# Patient Record
Sex: Male | Born: 1935
Health system: Southern US, Community
[De-identification: ages and names within clinical notes are randomized; demographics above are authoritative.]

## PROBLEM LIST (undated history)

## (undated) DIAGNOSIS — N183 Chronic kidney disease, stage 3 unspecified: Secondary | ICD-10-CM

## (undated) DIAGNOSIS — C679 Malignant neoplasm of bladder, unspecified: Secondary | ICD-10-CM

## (undated) DIAGNOSIS — L853 Xerosis cutis: Secondary | ICD-10-CM

## (undated) DIAGNOSIS — Z85528 Personal history of other malignant neoplasm of kidney: Secondary | ICD-10-CM

## (undated) DIAGNOSIS — H3530 Unspecified macular degeneration: Secondary | ICD-10-CM

## (undated) DIAGNOSIS — Q600 Renal agenesis, unilateral: Secondary | ICD-10-CM

## (undated) DIAGNOSIS — Z8719 Personal history of other diseases of the digestive system: Secondary | ICD-10-CM

## (undated) DIAGNOSIS — M1990 Unspecified osteoarthritis, unspecified site: Secondary | ICD-10-CM

## (undated) DIAGNOSIS — IMO0002 Reserved for concepts with insufficient information to code with codable children: Secondary | ICD-10-CM

## (undated) DIAGNOSIS — H9190 Unspecified hearing loss, unspecified ear: Secondary | ICD-10-CM

## (undated) DIAGNOSIS — N281 Cyst of kidney, acquired: Secondary | ICD-10-CM

## (undated) HISTORY — PX: TOTAL NEPHRECTOMY: SHX415

---
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
## 1978-02-18 HISTORY — PX: CHOLECYSTECTOMY: SHX55

## 2007-06-16 ENCOUNTER — Ambulatory Visit (HOSPITAL_COMMUNITY): Admission: RE | Admit: 2007-06-16 | Discharge: 2007-06-16 | Payer: Self-pay | Admitting: Ophthalmology

## 2007-06-30 ENCOUNTER — Ambulatory Visit (HOSPITAL_COMMUNITY): Admission: RE | Admit: 2007-06-30 | Discharge: 2007-06-30 | Payer: Self-pay | Admitting: Ophthalmology

## 2007-11-10 ENCOUNTER — Ambulatory Visit (HOSPITAL_COMMUNITY): Admission: RE | Admit: 2007-11-10 | Discharge: 2007-11-10 | Payer: Self-pay | Admitting: Ophthalmology

## 2010-02-18 HISTORY — PX: CATARACT EXTRACTION W/ INTRAOCULAR LENS  IMPLANT, BILATERAL: SHX1307

## 2010-04-10 ENCOUNTER — Other Ambulatory Visit (HOSPITAL_COMMUNITY): Payer: Self-pay | Admitting: Family Medicine

## 2010-04-10 ENCOUNTER — Ambulatory Visit (HOSPITAL_COMMUNITY)
Admission: RE | Admit: 2010-04-10 | Discharge: 2010-04-10 | Disposition: A | Discharge status: Home / Self Care | Payer: Medicare HMO | Source: Ambulatory Visit | Attending: Family Medicine | Admitting: Family Medicine

## 2010-04-10 DIAGNOSIS — F17200 Nicotine dependence, unspecified, uncomplicated: Secondary | ICD-10-CM | POA: Insufficient documentation

## 2010-04-10 DIAGNOSIS — Z87891 Personal history of nicotine dependence: Secondary | ICD-10-CM

## 2010-11-02 ENCOUNTER — Other Ambulatory Visit (HOSPITAL_COMMUNITY): Payer: Self-pay | Admitting: Family Medicine

## 2010-11-02 ENCOUNTER — Ambulatory Visit (HOSPITAL_COMMUNITY)
Admission: RE | Admit: 2010-11-02 | Discharge: 2010-11-02 | Disposition: A | Discharge status: Home / Self Care | Payer: Medicare HMO | Source: Ambulatory Visit | Attending: Family Medicine | Admitting: Family Medicine

## 2010-11-02 DIAGNOSIS — W19XXXA Unspecified fall, initial encounter: Secondary | ICD-10-CM

## 2010-11-02 DIAGNOSIS — R0789 Other chest pain: Secondary | ICD-10-CM | POA: Insufficient documentation

## 2010-11-13 LAB — BASIC METABOLIC PANEL
BUN: 19
Calcium: 8.3 — ABNORMAL LOW
Creatinine, Ser: 1.27
GFR calc non Af Amer: 55 — ABNORMAL LOW
Glucose, Bld: 92
Potassium: 4.4

## 2010-11-13 LAB — HEMOGLOBIN AND HEMATOCRIT, BLOOD: Hemoglobin: 13.4

## 2013-07-15 ENCOUNTER — Ambulatory Visit (INDEPENDENT_AMBULATORY_CARE_PROVIDER_SITE_OTHER): Payer: Medicare HMO | Admitting: Otolaryngology

## 2013-07-15 DIAGNOSIS — J310 Chronic rhinitis: Secondary | ICD-10-CM

## 2013-07-15 DIAGNOSIS — H902 Conductive hearing loss, unspecified: Secondary | ICD-10-CM

## 2013-07-15 DIAGNOSIS — J342 Deviated nasal septum: Secondary | ICD-10-CM

## 2013-07-15 DIAGNOSIS — H903 Sensorineural hearing loss, bilateral: Secondary | ICD-10-CM

## 2013-07-15 DIAGNOSIS — H6120 Impacted cerumen, unspecified ear: Secondary | ICD-10-CM

## 2013-07-15 DIAGNOSIS — J343 Hypertrophy of nasal turbinates: Secondary | ICD-10-CM

## 2013-07-15 DIAGNOSIS — H6980 Other specified disorders of Eustachian tube, unspecified ear: Secondary | ICD-10-CM

## 2013-08-19 ENCOUNTER — Ambulatory Visit (INDEPENDENT_AMBULATORY_CARE_PROVIDER_SITE_OTHER): Payer: Medicare HMO | Admitting: Otolaryngology

## 2013-08-19 DIAGNOSIS — H6980 Other specified disorders of Eustachian tube, unspecified ear: Secondary | ICD-10-CM

## 2013-08-19 DIAGNOSIS — H6520 Chronic serous otitis media, unspecified ear: Secondary | ICD-10-CM

## 2013-08-19 DIAGNOSIS — H902 Conductive hearing loss, unspecified: Secondary | ICD-10-CM

## 2013-09-23 ENCOUNTER — Ambulatory Visit (INDEPENDENT_AMBULATORY_CARE_PROVIDER_SITE_OTHER): Payer: Medicare HMO | Admitting: Otolaryngology

## 2013-09-23 DIAGNOSIS — H6980 Other specified disorders of Eustachian tube, unspecified ear: Secondary | ICD-10-CM

## 2013-09-23 DIAGNOSIS — H902 Conductive hearing loss, unspecified: Secondary | ICD-10-CM

## 2013-09-23 DIAGNOSIS — H66019 Acute suppurative otitis media with spontaneous rupture of ear drum, unspecified ear: Secondary | ICD-10-CM

## 2013-09-30 ENCOUNTER — Ambulatory Visit (INDEPENDENT_AMBULATORY_CARE_PROVIDER_SITE_OTHER): Payer: Medicare HMO | Admitting: Otolaryngology

## 2013-09-30 DIAGNOSIS — H908 Mixed conductive and sensorineural hearing loss, unspecified: Secondary | ICD-10-CM

## 2013-09-30 DIAGNOSIS — H66019 Acute suppurative otitis media with spontaneous rupture of ear drum, unspecified ear: Secondary | ICD-10-CM

## 2013-10-14 ENCOUNTER — Ambulatory Visit (INDEPENDENT_AMBULATORY_CARE_PROVIDER_SITE_OTHER): Payer: Medicare HMO | Admitting: Otolaryngology

## 2013-10-14 DIAGNOSIS — H6990 Unspecified Eustachian tube disorder, unspecified ear: Secondary | ICD-10-CM | POA: Diagnosis not present

## 2013-10-14 DIAGNOSIS — H902 Conductive hearing loss, unspecified: Secondary | ICD-10-CM

## 2013-10-14 DIAGNOSIS — H6980 Other specified disorders of Eustachian tube, unspecified ear: Secondary | ICD-10-CM | POA: Diagnosis not present

## 2013-10-14 DIAGNOSIS — H7200 Central perforation of tympanic membrane, unspecified ear: Secondary | ICD-10-CM | POA: Diagnosis not present

## 2014-04-07 ENCOUNTER — Ambulatory Visit (INDEPENDENT_AMBULATORY_CARE_PROVIDER_SITE_OTHER): Payer: Medicare HMO | Admitting: Otolaryngology

## 2014-04-07 DIAGNOSIS — H66012 Acute suppurative otitis media with spontaneous rupture of ear drum, left ear: Secondary | ICD-10-CM

## 2014-09-15 ENCOUNTER — Ambulatory Visit (INDEPENDENT_AMBULATORY_CARE_PROVIDER_SITE_OTHER): Payer: Medicare HMO | Admitting: Otolaryngology

## 2014-09-15 DIAGNOSIS — H66012 Acute suppurative otitis media with spontaneous rupture of ear drum, left ear: Secondary | ICD-10-CM

## 2014-09-29 ENCOUNTER — Ambulatory Visit (INDEPENDENT_AMBULATORY_CARE_PROVIDER_SITE_OTHER): Payer: Medicare HMO | Admitting: Otolaryngology

## 2014-09-29 DIAGNOSIS — H66013 Acute suppurative otitis media with spontaneous rupture of ear drum, bilateral: Secondary | ICD-10-CM | POA: Diagnosis not present

## 2015-09-14 DIAGNOSIS — H9042 Sensorineural hearing loss, unilateral, left ear, with unrestricted hearing on the contralateral side: Secondary | ICD-10-CM | POA: Diagnosis not present

## 2015-09-14 DIAGNOSIS — H539 Unspecified visual disturbance: Secondary | ICD-10-CM | POA: Diagnosis not present

## 2015-09-14 DIAGNOSIS — N183 Chronic kidney disease, stage 3 (moderate): Secondary | ICD-10-CM | POA: Diagnosis not present

## 2015-09-14 DIAGNOSIS — E785 Hyperlipidemia, unspecified: Secondary | ICD-10-CM | POA: Diagnosis not present

## 2015-09-14 DIAGNOSIS — M20029 Boutonniere deformity of unspecified finger(s): Secondary | ICD-10-CM | POA: Diagnosis not present

## 2015-09-14 DIAGNOSIS — Z9181 History of falling: Secondary | ICD-10-CM | POA: Diagnosis not present

## 2015-09-14 DIAGNOSIS — L570 Actinic keratosis: Secondary | ICD-10-CM | POA: Diagnosis not present

## 2015-09-15 DIAGNOSIS — H9042 Sensorineural hearing loss, unilateral, left ear, with unrestricted hearing on the contralateral side: Secondary | ICD-10-CM | POA: Diagnosis not present

## 2015-09-15 DIAGNOSIS — L570 Actinic keratosis: Secondary | ICD-10-CM | POA: Diagnosis not present

## 2015-09-20 DIAGNOSIS — I10 Essential (primary) hypertension: Secondary | ICD-10-CM | POA: Diagnosis not present

## 2015-09-20 DIAGNOSIS — E785 Hyperlipidemia, unspecified: Secondary | ICD-10-CM | POA: Diagnosis not present

## 2015-09-20 DIAGNOSIS — L570 Actinic keratosis: Secondary | ICD-10-CM | POA: Diagnosis not present

## 2015-09-20 DIAGNOSIS — N183 Chronic kidney disease, stage 3 (moderate): Secondary | ICD-10-CM | POA: Diagnosis not present

## 2015-09-27 DIAGNOSIS — H353131 Nonexudative age-related macular degeneration, bilateral, early dry stage: Secondary | ICD-10-CM | POA: Diagnosis not present

## 2015-09-27 DIAGNOSIS — H538 Other visual disturbances: Secondary | ICD-10-CM | POA: Diagnosis not present

## 2015-10-06 DIAGNOSIS — N183 Chronic kidney disease, stage 3 (moderate): Secondary | ICD-10-CM | POA: Diagnosis not present

## 2015-10-06 DIAGNOSIS — M069 Rheumatoid arthritis, unspecified: Secondary | ICD-10-CM | POA: Diagnosis not present

## 2015-10-06 DIAGNOSIS — E669 Obesity, unspecified: Secondary | ICD-10-CM | POA: Diagnosis not present

## 2015-10-09 DIAGNOSIS — H353131 Nonexudative age-related macular degeneration, bilateral, early dry stage: Secondary | ICD-10-CM | POA: Diagnosis not present

## 2015-11-07 DIAGNOSIS — E559 Vitamin D deficiency, unspecified: Secondary | ICD-10-CM | POA: Diagnosis not present

## 2015-11-07 DIAGNOSIS — Z79899 Other long term (current) drug therapy: Secondary | ICD-10-CM | POA: Diagnosis not present

## 2015-11-07 DIAGNOSIS — R809 Proteinuria, unspecified: Secondary | ICD-10-CM | POA: Diagnosis not present

## 2015-11-07 DIAGNOSIS — N183 Chronic kidney disease, stage 3 (moderate): Secondary | ICD-10-CM | POA: Diagnosis not present

## 2015-11-07 DIAGNOSIS — D509 Iron deficiency anemia, unspecified: Secondary | ICD-10-CM | POA: Diagnosis not present

## 2015-11-07 DIAGNOSIS — Z85528 Personal history of other malignant neoplasm of kidney: Secondary | ICD-10-CM | POA: Diagnosis not present

## 2015-11-07 DIAGNOSIS — I10 Essential (primary) hypertension: Secondary | ICD-10-CM | POA: Diagnosis not present

## 2015-11-07 DIAGNOSIS — Z905 Acquired absence of kidney: Secondary | ICD-10-CM | POA: Diagnosis not present

## 2015-11-07 DIAGNOSIS — D519 Vitamin B12 deficiency anemia, unspecified: Secondary | ICD-10-CM | POA: Diagnosis not present

## 2015-11-15 DIAGNOSIS — E559 Vitamin D deficiency, unspecified: Secondary | ICD-10-CM | POA: Diagnosis not present

## 2015-11-15 DIAGNOSIS — R809 Proteinuria, unspecified: Secondary | ICD-10-CM | POA: Diagnosis not present

## 2015-11-15 DIAGNOSIS — E872 Acidosis: Secondary | ICD-10-CM | POA: Diagnosis not present

## 2015-11-15 DIAGNOSIS — N2581 Secondary hyperparathyroidism of renal origin: Secondary | ICD-10-CM | POA: Diagnosis not present

## 2015-11-15 DIAGNOSIS — N184 Chronic kidney disease, stage 4 (severe): Secondary | ICD-10-CM | POA: Diagnosis not present

## 2015-11-23 ENCOUNTER — Other Ambulatory Visit (HOSPITAL_COMMUNITY): Payer: Self-pay | Admitting: Nephrology

## 2015-11-23 DIAGNOSIS — N183 Chronic kidney disease, stage 3 unspecified: Secondary | ICD-10-CM

## 2015-11-23 DIAGNOSIS — Z905 Acquired absence of kidney: Secondary | ICD-10-CM

## 2015-12-06 ENCOUNTER — Ambulatory Visit (HOSPITAL_COMMUNITY)
Admission: RE | Admit: 2015-12-06 | Discharge: 2015-12-06 | Disposition: A | Discharge status: Home / Self Care | Payer: Commercial Managed Care - HMO | Source: Ambulatory Visit | Attending: Nephrology | Admitting: Nephrology

## 2015-12-06 DIAGNOSIS — N183 Chronic kidney disease, stage 3 unspecified: Secondary | ICD-10-CM

## 2015-12-06 DIAGNOSIS — N281 Cyst of kidney, acquired: Secondary | ICD-10-CM | POA: Insufficient documentation

## 2015-12-06 DIAGNOSIS — Z905 Acquired absence of kidney: Secondary | ICD-10-CM | POA: Diagnosis not present

## 2015-12-06 DIAGNOSIS — N3289 Other specified disorders of bladder: Secondary | ICD-10-CM | POA: Diagnosis not present

## 2015-12-06 DIAGNOSIS — K5730 Diverticulosis of large intestine without perforation or abscess without bleeding: Secondary | ICD-10-CM | POA: Diagnosis not present

## 2015-12-06 DIAGNOSIS — I700 Atherosclerosis of aorta: Secondary | ICD-10-CM | POA: Insufficient documentation

## 2015-12-11 DIAGNOSIS — R310 Gross hematuria: Secondary | ICD-10-CM | POA: Diagnosis not present

## 2015-12-11 DIAGNOSIS — D414 Neoplasm of uncertain behavior of bladder: Secondary | ICD-10-CM | POA: Diagnosis not present

## 2015-12-13 ENCOUNTER — Other Ambulatory Visit: Payer: Self-pay | Admitting: Urology

## 2015-12-26 ENCOUNTER — Encounter (HOSPITAL_BASED_OUTPATIENT_CLINIC_OR_DEPARTMENT_OTHER): Payer: Self-pay | Admitting: *Deleted

## 2015-12-27 ENCOUNTER — Encounter (HOSPITAL_BASED_OUTPATIENT_CLINIC_OR_DEPARTMENT_OTHER): Payer: Self-pay | Admitting: *Deleted

## 2015-12-27 NOTE — Progress Notes (Signed)
Spoke w pt and wife.  Instructed pt to be npo p mn Sunday x clear liquids until 0700.  Then absolutely nothing by mouth.  To [facility] 11/13 @ 1330.  Needs istat 8, ekg on arrival.  Pt has CKD stage 3.  IV NS should be used.

## 2016-01-01 ENCOUNTER — Encounter (HOSPITAL_BASED_OUTPATIENT_CLINIC_OR_DEPARTMENT_OTHER): Payer: Self-pay | Admitting: *Deleted

## 2016-01-01 ENCOUNTER — Other Ambulatory Visit: Payer: Self-pay

## 2016-01-01 ENCOUNTER — Ambulatory Visit (HOSPITAL_BASED_OUTPATIENT_CLINIC_OR_DEPARTMENT_OTHER): Payer: Commercial Managed Care - HMO | Admitting: Anesthesiology

## 2016-01-01 ENCOUNTER — Encounter (HOSPITAL_BASED_OUTPATIENT_CLINIC_OR_DEPARTMENT_OTHER)
Admission: RE | Disposition: A | Discharge status: Home / Self Care | Payer: Self-pay | Source: Ambulatory Visit | Attending: Urology

## 2016-01-01 ENCOUNTER — Ambulatory Visit (HOSPITAL_BASED_OUTPATIENT_CLINIC_OR_DEPARTMENT_OTHER)
Admission: RE | Admit: 2016-01-01 | Discharge: 2016-01-01 | Disposition: A | Discharge status: Home / Self Care | Payer: Commercial Managed Care - HMO | Source: Ambulatory Visit | Attending: Urology | Admitting: Urology

## 2016-01-01 DIAGNOSIS — R310 Gross hematuria: Secondary | ICD-10-CM | POA: Diagnosis not present

## 2016-01-01 DIAGNOSIS — Z905 Acquired absence of kidney: Secondary | ICD-10-CM | POA: Diagnosis not present

## 2016-01-01 DIAGNOSIS — H9190 Unspecified hearing loss, unspecified ear: Secondary | ICD-10-CM | POA: Insufficient documentation

## 2016-01-01 DIAGNOSIS — Z85528 Personal history of other malignant neoplasm of kidney: Secondary | ICD-10-CM | POA: Diagnosis not present

## 2016-01-01 DIAGNOSIS — D494 Neoplasm of unspecified behavior of bladder: Secondary | ICD-10-CM | POA: Diagnosis not present

## 2016-01-01 DIAGNOSIS — C679 Malignant neoplasm of bladder, unspecified: Secondary | ICD-10-CM | POA: Diagnosis not present

## 2016-01-01 DIAGNOSIS — N135 Crossing vessel and stricture of ureter without hydronephrosis: Secondary | ICD-10-CM | POA: Diagnosis not present

## 2016-01-01 DIAGNOSIS — C672 Malignant neoplasm of lateral wall of bladder: Secondary | ICD-10-CM | POA: Insufficient documentation

## 2016-01-01 DIAGNOSIS — N183 Chronic kidney disease, stage 3 (moderate): Secondary | ICD-10-CM | POA: Diagnosis not present

## 2016-01-01 DIAGNOSIS — Z466 Encounter for fitting and adjustment of urinary device: Secondary | ICD-10-CM | POA: Diagnosis not present

## 2016-01-01 HISTORY — DX: Personal history of other malignant neoplasm of kidney: Z85.528

## 2016-01-01 HISTORY — DX: Chronic kidney disease, stage 3 unspecified: N18.30

## 2016-01-01 HISTORY — DX: Cyst of kidney, acquired: N28.1

## 2016-01-01 HISTORY — DX: Unspecified osteoarthritis, unspecified site: M19.90

## 2016-01-01 HISTORY — DX: Chronic kidney disease, stage 3 (moderate): N18.3

## 2016-01-01 HISTORY — DX: Personal history of other diseases of the digestive system: Z87.19

## 2016-01-01 HISTORY — DX: Unspecified macular degeneration: H35.30

## 2016-01-01 HISTORY — DX: Reserved for concepts with insufficient information to code with codable children: IMO0002

## 2016-01-01 HISTORY — DX: Renal agenesis, unilateral: Q60.0

## 2016-01-01 HISTORY — DX: Unspecified hearing loss, unspecified ear: H91.90

## 2016-01-01 HISTORY — PX: CYSTOSCOPY WITH RETROGRADE PYELOGRAM, URETEROSCOPY AND STENT PLACEMENT: SHX5789

## 2016-01-01 HISTORY — DX: Xerosis cutis: L85.3

## 2016-01-01 HISTORY — PX: TRANSURETHRAL RESECTION OF BLADDER TUMOR: SHX2575

## 2016-01-01 HISTORY — PX: CYSTOSCOPY WITH URETHRAL DILATATION: SHX5125

## 2016-01-01 LAB — POCT I-STAT, CHEM 8
BUN: 29 mg/dL — AB (ref 6–20)
CHLORIDE: 108 mmol/L (ref 101–111)
Calcium, Ion: 1.26 mmol/L (ref 1.15–1.40)
Creatinine, Ser: 1.7 mg/dL — ABNORMAL HIGH (ref 0.61–1.24)
Glucose, Bld: 93 mg/dL (ref 65–99)
HEMATOCRIT: 41 % (ref 39.0–52.0)
Hemoglobin: 13.9 g/dL (ref 13.0–17.0)
POTASSIUM: 4.4 mmol/L (ref 3.5–5.1)
SODIUM: 142 mmol/L (ref 135–145)
TCO2: 22 mmol/L (ref 0–100)

## 2016-01-01 SURGERY — TURBT (TRANSURETHRAL RESECTION OF BLADDER TUMOR)
Anesthesia: General | Site: Urethra | Laterality: Right

## 2016-01-01 MED ORDER — ROCURONIUM BROMIDE 100 MG/10ML IV SOLN
INTRAVENOUS | Status: DC | PRN
  Administered 2016-01-01: 40 mg via INTRAVENOUS

## 2016-01-01 MED ORDER — TRAMADOL HCL 50 MG PO TABS
50.0000 mg | ORAL_TABLET | Freq: Once | ORAL | Status: AC
  Administered 2016-01-01: 50 mg via ORAL
  Filled 2016-01-01: qty 1

## 2016-01-01 MED ORDER — IOHEXOL 300 MG/ML  SOLN
INTRAMUSCULAR | Status: DC | PRN
  Administered 2016-01-01: 2 mL

## 2016-01-01 MED ORDER — ROCURONIUM BROMIDE 50 MG/5ML IV SOSY
PREFILLED_SYRINGE | INTRAVENOUS | Status: AC
  Filled 2016-01-01: qty 5

## 2016-01-01 MED ORDER — FENTANYL CITRATE (PF) 100 MCG/2ML IJ SOLN
25.0000 ug | INTRAMUSCULAR | Status: DC | PRN
  Filled 2016-01-01: qty 1

## 2016-01-01 MED ORDER — DEXAMETHASONE SODIUM PHOSPHATE 10 MG/ML IJ SOLN
INTRAMUSCULAR | Status: AC
  Filled 2016-01-01: qty 1

## 2016-01-01 MED ORDER — SUGAMMADEX SODIUM 200 MG/2ML IV SOLN
INTRAVENOUS | Status: AC
  Filled 2016-01-01: qty 2

## 2016-01-01 MED ORDER — EPHEDRINE 5 MG/ML INJ
INTRAVENOUS | Status: AC
  Filled 2016-01-01: qty 10

## 2016-01-01 MED ORDER — CEFAZOLIN SODIUM-DEXTROSE 2-4 GM/100ML-% IV SOLN
INTRAVENOUS | Status: AC
  Filled 2016-01-01: qty 100

## 2016-01-01 MED ORDER — PROPOFOL 10 MG/ML IV BOLUS
INTRAVENOUS | Status: DC | PRN
  Administered 2016-01-01: 170 mg via INTRAVENOUS
  Administered 2016-01-01: 30 mg via INTRAVENOUS

## 2016-01-01 MED ORDER — FENTANYL CITRATE (PF) 100 MCG/2ML IJ SOLN
INTRAMUSCULAR | Status: AC
  Filled 2016-01-01: qty 2

## 2016-01-01 MED ORDER — CEFAZOLIN IN D5W 1 GM/50ML IV SOLN
1.0000 g | INTRAVENOUS | Status: AC
  Administered 2016-01-01: 2 g via INTRAVENOUS
  Filled 2016-01-01: qty 50

## 2016-01-01 MED ORDER — LACTATED RINGERS IV SOLN
INTRAVENOUS | Status: DC
  Filled 2016-01-01: qty 1000

## 2016-01-01 MED ORDER — TRAMADOL HCL 50 MG PO TABS
ORAL_TABLET | ORAL | Status: AC
  Filled 2016-01-01: qty 1

## 2016-01-01 MED ORDER — ONDANSETRON HCL 4 MG/2ML IJ SOLN
INTRAMUSCULAR | Status: AC
  Filled 2016-01-01: qty 2

## 2016-01-01 MED ORDER — SUGAMMADEX SODIUM 200 MG/2ML IV SOLN
INTRAVENOUS | Status: DC | PRN
  Administered 2016-01-01: 200 mg via INTRAVENOUS
  Administered 2016-01-01: 100 mg via INTRAVENOUS

## 2016-01-01 MED ORDER — SODIUM CHLORIDE 0.9 % IV SOLN
INTRAVENOUS | Status: DC
  Administered 2016-01-01: 14:00:00 via INTRAVENOUS
  Filled 2016-01-01: qty 1000

## 2016-01-01 MED ORDER — ONDANSETRON HCL 4 MG/2ML IJ SOLN
INTRAMUSCULAR | Status: DC | PRN
Start: 2016-01-01 — End: 2016-01-01
  Administered 2016-01-01: 4 mg via INTRAVENOUS

## 2016-01-01 MED ORDER — PROPOFOL 10 MG/ML IV BOLUS
INTRAVENOUS | Status: AC
  Filled 2016-01-01: qty 40

## 2016-01-01 MED ORDER — FENTANYL CITRATE (PF) 100 MCG/2ML IJ SOLN
INTRAMUSCULAR | Status: DC | PRN
  Administered 2016-01-01 (×2): 50 ug via INTRAVENOUS

## 2016-01-01 MED ORDER — DEXAMETHASONE SODIUM PHOSPHATE 4 MG/ML IJ SOLN
INTRAMUSCULAR | Status: DC | PRN
  Administered 2016-01-01: 5 mg via INTRAVENOUS

## 2016-01-01 MED ORDER — SODIUM CHLORIDE 0.9 % IR SOLN
Status: DC | PRN
  Administered 2016-01-01: 6000 mL

## 2016-01-01 MED ORDER — TRAMADOL HCL 50 MG PO TABS: 50.0000 mg | ORAL_TABLET | Freq: Four times a day (QID) | ORAL | 0 refills | Status: DC | PRN

## 2016-01-01 MED ORDER — EPHEDRINE SULFATE-NACL 50-0.9 MG/10ML-% IV SOSY
PREFILLED_SYRINGE | INTRAVENOUS | Status: DC | PRN
  Administered 2016-01-01 (×2): 10 mg via INTRAVENOUS

## 2016-01-01 SURGICAL SUPPLY — 50 items
BAG DRAIN URO-CYSTO SKYTR STRL (DRAIN) ×5 IMPLANT
BAG DRN ANRFLXCHMBR STRAP LEK (BAG)
BAG DRN UROCATH (DRAIN) ×3
BAG URINE DRAINAGE (UROLOGICAL SUPPLIES) IMPLANT
BAG URINE LEG 19OZ MD ST LTX (BAG) IMPLANT
BAG URINE LEG 500ML (DRAIN) ×2 IMPLANT
BASKET DAKOTA 1.9FR 11X120 (BASKET) IMPLANT
BASKET LASER NITINOL 1.9FR (BASKET) IMPLANT
BASKET ZERO TIP NITINOL 2.4FR (BASKET) IMPLANT
BSKT STON RTRVL 120 1.9FR (BASKET)
BSKT STON RTRVL ZERO TP 2.4FR (BASKET)
CATH FOLEY 2WAY SLVR  5CC 22FR (CATHETERS)
CATH FOLEY 2WAY SLVR 30CC 20FR (CATHETERS) IMPLANT
CATH FOLEY 2WAY SLVR 30CC 22FR (CATHETERS) IMPLANT
CATH FOLEY 2WAY SLVR 5CC 22FR (CATHETERS) IMPLANT
CATH FOLEY 3WAY 30CC 22F (CATHETERS) ×2 IMPLANT
CATH INTERMIT  6FR 70CM (CATHETERS) ×5 IMPLANT
CLOTH BEACON ORANGE TIMEOUT ST (SAFETY) ×5 IMPLANT
ELECT REM PT RETURN 9FT ADLT (ELECTROSURGICAL) ×5
ELECTRODE REM PT RTRN 9FT ADLT (ELECTROSURGICAL) ×3 IMPLANT
EVACUATOR MICROVAS BLADDER (UROLOGICAL SUPPLIES) IMPLANT
FIBER LASER FLEXIVA 365 (UROLOGICAL SUPPLIES) IMPLANT
FIBER LASER TRAC TIP (UROLOGICAL SUPPLIES) IMPLANT
GLOVE BIO SURGEON STRL SZ8 (GLOVE) ×5 IMPLANT
GLOVE BIOGEL PI IND STRL 7.0 (GLOVE) IMPLANT
GLOVE BIOGEL PI IND STRL 7.5 (GLOVE) ×2 IMPLANT
GLOVE BIOGEL PI INDICATOR 7.0 (GLOVE) ×4
GLOVE BIOGEL PI INDICATOR 7.5 (GLOVE) ×4
GLOVE ECLIPSE 7.0 STRL STRAW (GLOVE) ×3 IMPLANT
GOWN STRL REUS W/ TWL LRG LVL3 (GOWN DISPOSABLE) ×3 IMPLANT
GOWN STRL REUS W/ TWL XL LVL3 (GOWN DISPOSABLE) ×3 IMPLANT
GOWN STRL REUS W/TWL LRG LVL3 (GOWN DISPOSABLE) ×7 IMPLANT
GOWN STRL REUS W/TWL XL LVL3 (GOWN DISPOSABLE) ×5
GUIDEWIRE ANG ZIPWIRE 038X150 (WIRE) ×5 IMPLANT
GUIDEWIRE STR DUAL SENSOR (WIRE) ×3 IMPLANT
HOLDER FOLEY CATH W/STRAP (MISCELLANEOUS) ×2 IMPLANT
IV NS IRRIG 3000ML ARTHROMATIC (IV SOLUTION) ×9 IMPLANT
KIT ROOM TURNOVER WOR (KITS) ×5 IMPLANT
MANIFOLD NEPTUNE II (INSTRUMENTS) ×3 IMPLANT
PACK CYSTO (CUSTOM PROCEDURE TRAY) ×5 IMPLANT
PLUG CATH AND CAP STER (CATHETERS) ×2 IMPLANT
SET ASPIRATION TUBING (TUBING) IMPLANT
STENT URET 6FRX26 CONTOUR (STENTS) ×3 IMPLANT
SYR 20CC LL (SYRINGE) IMPLANT
SYR 30ML LL (SYRINGE) ×3 IMPLANT
SYRINGE 10CC LL (SYRINGE) ×5 IMPLANT
SYRINGE IRR TOOMEY STRL 70CC (SYRINGE) ×2 IMPLANT
TUBE CONNECTING 12'X1/4 (SUCTIONS)
TUBE CONNECTING 12X1/4 (SUCTIONS) IMPLANT
TUBE FEEDING 8FR 16IN STR KANG (MISCELLANEOUS) IMPLANT

## 2016-01-01 NOTE — Anesthesia Procedure Notes (Signed)
Procedure Name: LMA Insertion Date/Time: 01/01/2016 2:38 PM Performed by: Wanita Chamberlain Pre-anesthesia Checklist: Patient identified, Timeout performed, Emergency Drugs available, Suction available and Patient being monitored Patient Re-evaluated:Patient Re-evaluated prior to inductionOxygen Delivery Method: Circle system utilized Preoxygenation: Pre-oxygenation with 100% oxygen Intubation Type: IV induction Ventilation: Mask ventilation without difficulty Laryngoscope Size: Mac and 3 Grade View: Grade II Tube type: Oral Tube size: 8.0 mm Number of attempts: 1 Airway Equipment and Method: Stylet Placement Confirmation: positive ETCO2,  CO2 detector,  breath sounds checked- equal and bilateral and ETT inserted through vocal cords under direct vision Secured at: 22 cm Tube secured with: Tape Dental Injury: Teeth and Oropharynx as per pre-operative assessment

## 2016-01-01 NOTE — H&P (Signed)
Urology Admission H&P  Chief Complaint: gross hematuria  History of Present Illness: Mr Caples is a 82yo with gross hematuria who was found to have a bladder tumor on Ct scan.  Past Medical History:  Diagnosis Date  . Arthritis    rheumatoid  . Bladder tumor   . CKD (chronic kidney disease), stage III   . Dry skin    generalized itching ?d/t kidney disease  . Dyspnea    with exertion  . H/O: GI bleed    d/t ASA  . Hematuria   . History of renal cell carcinoma    1980's left nephrectomy  . HOH (hard of hearing)    right ear better than left  . Macular degeneration of both eyes   . Renal cyst, right   . Solitary right kidney    acquired-- s/p left nephrectomy 1980's   Past Surgical History:  Procedure Laterality Date  . APPENDECTOMY  1954  . CHOLECYSTECTOMY  1980  . EYE SURGERY Bilateral    cataract extractions    Home Medications:  Prescriptions Prior to Admission  Medication Sig Dispense Refill Last Dose  . acetaminophen (TYLENOL) 325 MG tablet Take 650 mg by mouth every 6 (six) hours as needed.   Past Month at Unknown time  . sodium bicarbonate 650 MG tablet Take 1,000 mg by mouth 3 (three) times daily.   Past Month at Unknown time   Allergies: No Known Allergies  History reviewed. No pertinent family history. Social History:  reports that he quit smoking about 10 years ago. His smoking use included Pipe. He has never used smokeless tobacco. He reports that he does not drink alcohol. His drug history is not on file.  Review of Systems  Genitourinary: Positive for hematuria.  All other systems reviewed and are negative.   Physical Exam:  Vital signs in last 24 hours: Temp:  [97.5 F (36.4 C)] 97.5 F (36.4 C) (11/13 1318) Pulse Rate:  [79] 79 (11/13 1318) Resp:  [16] 16 (11/13 1318) BP: (148)/(87) 148/87 (11/13 1318) SpO2:  [98 %] 98 % (11/13 1318) Weight:  [114.3 kg (252 lb)] 114.3 kg (252 lb) (11/13 1318) Physical Exam  Constitutional: He is oriented  to person, place, and time. He appears well-developed and well-nourished.  HENT:  Head: Normocephalic and atraumatic.  Eyes: EOM are normal. Pupils are equal, round, and reactive to light.  Neck: Normal range of motion. No thyromegaly present.  Cardiovascular: Normal rate and regular rhythm.   Respiratory: No respiratory distress.  GI: Soft. He exhibits no distension.  Musculoskeletal: Normal range of motion. He exhibits no edema.  Neurological: He is alert and oriented to person, place, and time.  Skin: Skin is warm and dry.  Psychiatric: He has a normal mood and affect. His behavior is normal. Judgment and thought content normal.    Laboratory Data:  No results found for this or any previous visit (from the past 24 hour(s)). No results found for this or any previous visit (from the past 240 hour(s)). Creatinine: No results for input(s): CREATININE in the last 168 hours. Baseline Creatinine: unknown  Impression/Assessment:  82yo with bladder tumor  Plan:  The risks/benefits/alternatives to TURBT was explained to the patient and he understands and wishes to proceed with surgery   Nicolette Bang 01/01/2016, 2:15 PM

## 2016-01-01 NOTE — Transfer of Care (Signed)
Last Vitals:  Vitals:   01/01/16 1318  BP: (!) 148/87  Pulse: 79  Resp: 16  Temp: 36.4 C    Last Pain:  Vitals:   01/01/16 1318  TempSrc: Oral      Patients Stated Pain Goal: 6 (01/01/16 1340)  Immediate Anesthesia Transfer of Care Note  Patient: Scott Harvey  Procedure(s) Performed: Procedure(s) (LRB): TRANSURETHRAL RESECTION OF BLADDER TUMOR (TURBT) (N/A) CYSTOSCOPY WITH RETROGRADE PYELOGRAM/URETERAL STENT PLACEMENT (Right) CYSTOSCOPY WITH URETHRAL DILATATION (N/A)  Patient Location: PACU  Anesthesia Type: General  Level of Consciousness: awake, alert  and oriented  Airway & Oxygen Therapy: Patient Spontanous Breathing and Patient connected to face mask oxygen  Post-op Assessment: Report given to PACU RN and Post -op Vital signs reviewed and stable  Post vital signs: Reviewed and stable  Complications: No apparent anesthesia complications

## 2016-01-01 NOTE — Discharge Instructions (Signed)
Post Anesthesia Home Care Instructions  Activity: Get plenty of rest for the remainder of the day. A responsible adult should stay with you for 24 hours following the procedure.  For the next 24 hours, DO NOT: -Drive a car -Paediatric nurse -Drink alcoholic beverages -Take any medication unless instructed by your physician -Make any legal decisions or sign important papers.  Meals: Start with liquid foods such as gelatin or soup. Progress to regular foods as tolerated. Avoid greasy, spicy, heavy foods. If nausea and/or vomiting occur, drink only clear liquids until the nausea and/or vomiting subsides. Call your physician if vomiting continues.  Special Instructions/Symptoms: Your throat may feel dry or sore from the anesthesia or the breathing tube placed in your throat during surgery. If this causes discomfort, gargle with warm salt water. The discomfort should disappear within 24 hours.  If you had a scopolamine patch placed behind your ear for the management of post- operative nausea and/or vomiting:  1. The medication in the patch is effective for 72 hours, after which it should be removed.  Wrap patch in a tissue and discard in the trash. Wash hands thoroughly with soap and water. 2. You may remove the patch earlier than 72 hours if you experience unpleasant side effects which may include dry mouth, dizziness or visual disturbances. 3. Avoid touching the patch. Wash your hands with soap and water after contact with the patch.   Foley Catheter Care, Adult A Foley catheter is a soft, flexible tube. This tube is placed into your bladder to drain pee (urine). If you go home with this catheter in place, follow the instructions below. TAKING CARE OF THE CATHETER 1. Wash your hands with soap and water. 2. Put soap and water on a clean washcloth.  Clean the skin where the tube goes into your body.  Clean away from the tube site.  Never wipe toward the tube.  Clean the area using a  circular motion.  Remove all the soap. Pat the area dry with a clean towel. For males, reposition the skin that covers the end of the penis (foreskin). 3. Attach the tube to your leg with tape or a leg strap. Do not stretch the tube tight. If you are using tape, remove any stickiness left behind by past tape you used. 4. Keep the drainage bag below your hips. Keep it off the floor. 5. Check your tube during the day. Make sure it is working and draining. Make sure the tube does not curl, twist, or bend. 6. Do not pull on the tube or try to take it out. TAKING CARE OF THE DRAINAGE BAGS You will have a large overnight drainage bag and a small leg bag. You may wear the overnight bag any time. Never wear the small bag at night. Follow the directions below. Emptying the Drainage Bag Empty your drainage bag when it is  - full or at least 2-3 times a day. 1. Wash your hands with soap and water. 2. Keep the drainage bag below your hips. 3. Hold the dirty bag over the toilet or clean container. 4. Open the pour spout at the bottom of the bag. Empty the pee into the toilet or container. Do not let the pour spout touch anything. 5. Clean the pour spout with a gauze pad or cotton ball that has rubbing alcohol on it. 6. Close the pour spout. 7. Attach the bag to your leg with tape or a leg strap. 8. Wash your hands well. Changing  the Drainage Bag Change your bag once a month or sooner if it starts to smell or look dirty.  1. Wash your hands with soap and water. 2. Pinch the rubber tube so that pee does not spill out. 3. Disconnect the catheter tube from the drainage tube at the connection valve. Do not let the tubes touch anything. 4. Clean the end of the catheter tube with an alcohol wipe. Clean the end of a the drainage tube with a different alcohol wipe. 5. Connect the catheter tube to the drainage tube of the clean drainage bag. 6. Attach the new bag to the leg with tape or a leg strap. Avoid  attaching the new bag too tightly. 7. Wash your hands well. Cleaning the Drainage Bag 1. Wash your hands with soap and water. 2. Wash the bag in warm, soapy water. 3. Rinse the bag with warm water. 4. Fill the bag with a mixture of white vinegar and water (1 cup vinegar to 1 quart warm water [.2 liter vinegar to 1 liter warm water]). Close the bag and soak it for 30 minutes in the solution. 5. Rinse the bag with warm water. 6. Hang the bag to dry with the pour spout open and hanging downward. 7. Store the clean bag (once it is dry) in a clean plastic bag. 8. Wash your hands well. PREVENT INFECTION  Wash your hands before and after touching your tube.  Take showers every day. Wash the skin where the tube enters your body. Do not take baths. Replace wet leg straps with dry ones, if this applies.  Do not use powders, sprays, or lotions on the genital area. Only use creams, lotions, or ointments as told by your doctor.  For females, wipe from front to back after going to the bathroom.  Drink enough fluids to keep your pee clear or pale yellow unless you are told not to have too much fluid (fluid restriction).  Do not let the drainage bag or tubing touch or lie on the floor.  Wear cotton underwear to keep the area dry. GET HELP IF:  Your pee is cloudy or smells unusually bad.  Your tube becomes clogged.  You are not draining pee into the bag or your bladder feels full.  Your tube starts to leak. GET HELP RIGHT AWAY IF:  You have pain, puffiness (swelling), redness, or yellowish-white fluid (pus) where the tube enters the body.  You have pain in the belly (abdomen), legs, lower back, or bladder.  You have a fever.  You see blood fill the tube, or your pee is pink or red.  You feel sick to your stomach (nauseous), throw up (vomit), or have chills.  Your tube gets pulled out. MAKE SURE YOU:   Understand these instructions.  Will watch your condition.  Will get help right  away if you are not doing well or get worse.   This information is not intended to replace advice given to you by your health care provider. Make sure you discuss any questions you have with your health care provider.   Document Released: 06/01/2012 Document Revised: 02/25/2014 Document Reviewed: 06/01/2012 Elsevier Interactive Patient Education [employer].

## 2016-01-01 NOTE — Anesthesia Preprocedure Evaluation (Addendum)
Anesthesia Evaluation  Patient identified by MRN, date of birth, ID band Patient awake    Reviewed: Allergy & Precautions, NPO status , Patient's Chart, lab work & pertinent test results  Airway Mallampati: II  TM Distance: >3 FB Neck ROM: Full    Dental  (+) Dental Advisory Given   Pulmonary former smoker,    breath sounds clear to auscultation       Cardiovascular negative cardio ROS   Rhythm:Regular Rate:Normal     Neuro/Psych negative neurological ROS  negative psych ROS   GI/Hepatic negative GI ROS, Neg liver ROS,   Endo/Other  negative endocrine ROS  Renal/GU CRFRenal disease  negative genitourinary   Musculoskeletal  (+) Arthritis , Osteoarthritis,    Abdominal   Peds negative pediatric ROS (+)  Hematology negative hematology ROS (+)   Anesthesia Other Findings   Reproductive/Obstetrics negative OB ROS                            Lab Results  Component Value Date   HGB 13.4 06/11/2007   HCT 38.6 (L) 06/11/2007   Lab Results  Component Value Date   CREATININE 1.27 06/11/2007   BUN 19 06/11/2007   NA 142 06/11/2007   K 4.4 06/11/2007   CL 109 06/11/2007   CO2 28 06/11/2007    Anesthesia Physical Anesthesia Plan  ASA: III  Anesthesia Plan: General   Post-op Pain Management:    Induction: Intravenous  Airway Management Planned: Oral ETT  Additional Equipment:   Intra-op Plan:   Post-operative Plan: Extubation in OR  Informed Consent: I have reviewed the patients History and Physical, chart, labs and discussed the procedure including the risks, benefits and alternatives for the proposed anesthesia with the patient or authorized representative who has indicated his/her understanding and acceptance.   Dental advisory given  Plan Discussed with: CRNA  Anesthesia Plan Comments:        Anesthesia Quick Evaluation

## 2016-01-01 NOTE — Op Note (Signed)
Preoperative diagnosis: Bladder Tumor  Postop diagnosis: Same  Procedure: 1.  Cystoscopy 2. right retrograde pyelography 3. Intra-operative fluoroscopy, under 1 hour, with interpretation 4.Transurethral resection of bladder tumor, large 5. Right 6x26 JJ ureteral stent placementStent Placement     Attending: Nicolette Bang  Anesthesia: General  Estimated blood loss: 5 cc  Drains: 1. 22 French Foley catheter 2. Right 6x26 JJ ureteral stent without tether   Specimens: Bladder tumor  Antibiotics: Ancef  Findings: narrowing of right distal ureter without associated hydronephrosis. 7cm papillary tumor involving the right lateral wall  Indications: Patient is a 82 year old with a history of  bladder tumor found on CT scan. He has a history of a solitary kidney.   After discussing treatment options patient decided to proceed with transurethral resection of bladder tumor  Procedure in detail: Prior to procedure consetn was obtained. Patient was brought to the operating room and briefing was done sure correct patient, correct procedure, correct site.  General anesthesia was in administered patient was placed in the dorsal lithotomy position.  The rigid 9 French cystoscope was passed urethra and bladder.  Bladder was inspected masses or lesions and we noted a 7 cm lesion in the right lateral wall of the bladder 1cm from the ureteral orifice.  We then cannulated the right ureteral orifice with a 6 French ureteral catheter.  A gentle retrograde was obtained in findings noted above.   We then placed a zip wire through the ureteral catheter and advanced up to the renal pelvis.  We then placed a 6 x 26 double-J ureteral stent over the wire.  We then removed the wire and good coil was noted in the pelvis under fluoroscopy in the bladder under direct vision.  Removed the cystoscope and placed a 90 French resectoscope in the bladder.  Using bipolar electrocautery were then removed the  bladder tumor.  We  removed the bladder tumor down to the base exposing muscle.  We then removed the pieces and sent them for pathology.  To obtain hemostasis we then cauterized the bed of the tumors.  Once good hemostasis was noted the bladder was then drained and a 22 French Foley catheter was placed. This concluded the procedure which resulted by the patient.  Complications: None  Condition: Stable,  extubated, transferred to PACU.  Plan: The patient is to be discharged home. He is to followup in 5 days for a voiding trial

## 2016-01-02 ENCOUNTER — Encounter (HOSPITAL_BASED_OUTPATIENT_CLINIC_OR_DEPARTMENT_OTHER): Payer: Self-pay | Admitting: Urology

## 2016-01-02 NOTE — Anesthesia Postprocedure Evaluation (Signed)
Anesthesia Post Note  Patient: Scott Harvey  Procedure(s) Performed: Procedure(s) (LRB): TRANSURETHRAL RESECTION OF BLADDER TUMOR (TURBT) (N/A) CYSTOSCOPY WITH RETROGRADE PYELOGRAM/URETERAL STENT PLACEMENT (Right) CYSTOSCOPY WITH URETHRAL DILATATION (N/A)  Patient location during evaluation: PACU Anesthesia Type: General Level of consciousness: awake and alert Pain management: pain level controlled Vital Signs Assessment: post-procedure vital signs reviewed and stable Respiratory status: spontaneous breathing, nonlabored ventilation, respiratory function stable and patient connected to nasal cannula oxygen Cardiovascular status: blood pressure returned to baseline and stable Postop Assessment: no signs of nausea or vomiting Anesthetic complications: no    Last Vitals:  Vitals:   01/01/16 1600 01/01/16 1634  BP: (!) 141/91 (!) 171/85  Pulse: 69 70  Resp: 19   Temp:  36.5 C    Last Pain:  Vitals:   01/01/16 1634  TempSrc:   PainSc: [place] Antwyne Pingree

## 2016-01-05 DIAGNOSIS — C678 Malignant neoplasm of overlapping sites of bladder: Secondary | ICD-10-CM | POA: Diagnosis not present

## 2016-01-09 ENCOUNTER — Other Ambulatory Visit: Payer: Self-pay | Admitting: Urology

## 2016-01-18 DIAGNOSIS — Z85528 Personal history of other malignant neoplasm of kidney: Secondary | ICD-10-CM | POA: Diagnosis not present

## 2016-01-18 DIAGNOSIS — M069 Rheumatoid arthritis, unspecified: Secondary | ICD-10-CM | POA: Diagnosis not present

## 2016-01-18 DIAGNOSIS — N184 Chronic kidney disease, stage 4 (severe): Secondary | ICD-10-CM | POA: Diagnosis not present

## 2016-01-19 DIAGNOSIS — M064 Inflammatory polyarthropathy: Secondary | ICD-10-CM | POA: Diagnosis not present

## 2016-01-19 DIAGNOSIS — Z85528 Personal history of other malignant neoplasm of kidney: Secondary | ICD-10-CM | POA: Diagnosis not present

## 2016-01-19 DIAGNOSIS — N184 Chronic kidney disease, stage 4 (severe): Secondary | ICD-10-CM | POA: Diagnosis not present

## 2016-02-01 ENCOUNTER — Encounter (HOSPITAL_BASED_OUTPATIENT_CLINIC_OR_DEPARTMENT_OTHER): Payer: Self-pay | Admitting: *Deleted

## 2016-02-01 NOTE — Progress Notes (Addendum)
SPOKE W/ WIFE.  NPO AFTER MN W/ EXCEPTION CLEAR LIQUIDS UNTIL 0730 (NO CREAM / MILK PRODUCTS) .  ARRIVE AT 1200.  NEEDS ISTAT 8.  CURRENT EKG IN CHART AND EPIC.

## 2016-02-05 ENCOUNTER — Ambulatory Visit (HOSPITAL_BASED_OUTPATIENT_CLINIC_OR_DEPARTMENT_OTHER): Admission: RE | Admit: 2016-02-05 | Payer: Commercial Managed Care - HMO | Source: Ambulatory Visit | Admitting: Urology

## 2016-02-05 ENCOUNTER — Other Ambulatory Visit: Payer: Self-pay | Admitting: Urology

## 2016-02-05 ENCOUNTER — Encounter (HOSPITAL_COMMUNITY): Payer: Self-pay

## 2016-02-05 HISTORY — DX: Malignant neoplasm of bladder, unspecified: C67.9

## 2016-02-05 SURGERY — TURBT (TRANSURETHRAL RESECTION OF BLADDER TUMOR)
Anesthesia: General

## 2016-02-06 ENCOUNTER — Encounter (HOSPITAL_COMMUNITY)
Admission: RE | Admit: 2016-02-06 | Discharge: 2016-02-06 | Disposition: A | Discharge status: Home / Self Care | Payer: Commercial Managed Care - HMO | Source: Ambulatory Visit | Attending: Urology | Admitting: Urology

## 2016-02-07 ENCOUNTER — Ambulatory Visit (HOSPITAL_COMMUNITY): Payer: Commercial Managed Care - HMO | Admitting: Anesthesiology

## 2016-02-07 ENCOUNTER — Ambulatory Visit (HOSPITAL_COMMUNITY): Payer: Commercial Managed Care - HMO

## 2016-02-07 ENCOUNTER — Encounter (HOSPITAL_COMMUNITY)
Admission: RE | Disposition: A | Discharge status: Home / Self Care | Payer: Self-pay | Source: Ambulatory Visit | Attending: Urology

## 2016-02-07 ENCOUNTER — Ambulatory Visit (HOSPITAL_COMMUNITY)
Admission: RE | Admit: 2016-02-07 | Discharge: 2016-02-07 | Disposition: A | Discharge status: Home / Self Care | Payer: Commercial Managed Care - HMO | Source: Ambulatory Visit | Attending: Urology | Admitting: Urology

## 2016-02-07 ENCOUNTER — Encounter (HOSPITAL_COMMUNITY): Payer: Self-pay | Admitting: *Deleted

## 2016-02-07 DIAGNOSIS — Z9841 Cataract extraction status, right eye: Secondary | ICD-10-CM | POA: Insufficient documentation

## 2016-02-07 DIAGNOSIS — N3289 Other specified disorders of bladder: Secondary | ICD-10-CM | POA: Diagnosis not present

## 2016-02-07 DIAGNOSIS — H3530 Unspecified macular degeneration: Secondary | ICD-10-CM | POA: Diagnosis not present

## 2016-02-07 DIAGNOSIS — Z87891 Personal history of nicotine dependence: Secondary | ICD-10-CM | POA: Diagnosis not present

## 2016-02-07 DIAGNOSIS — Z905 Acquired absence of kidney: Secondary | ICD-10-CM | POA: Insufficient documentation

## 2016-02-07 DIAGNOSIS — D494 Neoplasm of unspecified behavior of bladder: Secondary | ICD-10-CM | POA: Diagnosis not present

## 2016-02-07 DIAGNOSIS — Z85528 Personal history of other malignant neoplasm of kidney: Secondary | ICD-10-CM | POA: Diagnosis not present

## 2016-02-07 DIAGNOSIS — M1990 Unspecified osteoarthritis, unspecified site: Secondary | ICD-10-CM | POA: Diagnosis not present

## 2016-02-07 DIAGNOSIS — N3090 Cystitis, unspecified without hematuria: Secondary | ICD-10-CM | POA: Diagnosis not present

## 2016-02-07 DIAGNOSIS — Z961 Presence of intraocular lens: Secondary | ICD-10-CM | POA: Insufficient documentation

## 2016-02-07 DIAGNOSIS — Z9842 Cataract extraction status, left eye: Secondary | ICD-10-CM | POA: Diagnosis not present

## 2016-02-07 DIAGNOSIS — N183 Chronic kidney disease, stage 3 (moderate): Secondary | ICD-10-CM | POA: Insufficient documentation

## 2016-02-07 DIAGNOSIS — C679 Malignant neoplasm of bladder, unspecified: Secondary | ICD-10-CM | POA: Diagnosis not present

## 2016-02-07 HISTORY — PX: CYSTOSCOPY WITH FULGERATION: SHX6638

## 2016-02-07 LAB — BASIC METABOLIC PANEL
Anion gap: 6 (ref 5–15)
BUN: 24 mg/dL — AB (ref 6–20)
CALCIUM: 8.7 mg/dL — AB (ref 8.9–10.3)
CO2: 20 mmol/L — AB (ref 22–32)
Chloride: 113 mmol/L — ABNORMAL HIGH (ref 101–111)
Creatinine, Ser: 1.64 mg/dL — ABNORMAL HIGH (ref 0.61–1.24)
GFR calc Af Amer: 42 mL/min — ABNORMAL LOW (ref >60)
GFR calc non Af Amer: 37 mL/min — ABNORMAL LOW (ref >60)
GLUCOSE: 97 mg/dL (ref 65–99)
Potassium: 4.6 mmol/L (ref 3.5–5.1)
Sodium: 139 mmol/L (ref 135–145)

## 2016-02-07 SURGERY — CYSTOSCOPY, WITH BLADDER FULGURATION
Anesthesia: General

## 2016-02-07 MED ORDER — ROCURONIUM 10MG/ML (10ML) SYRINGE FOR MEDFUSION PUMP - OPTIME
INTRAVENOUS | Status: DC | PRN
  Administered 2016-02-07: 15 mg via INTRAVENOUS
  Administered 2016-02-07: 5 mg via INTRAVENOUS

## 2016-02-07 MED ORDER — FENTANYL CITRATE (PF) 100 MCG/2ML IJ SOLN
INTRAMUSCULAR | Status: DC | PRN
  Administered 2016-02-07 (×3): 50 ug via INTRAVENOUS

## 2016-02-07 MED ORDER — CEFAZOLIN IN D5W 1 GM/50ML IV SOLN
1.0000 g | INTRAVENOUS | Status: DC
  Filled 2016-02-07: qty 50

## 2016-02-07 MED ORDER — ONDANSETRON HCL 4 MG/2ML IJ SOLN
4.0000 mg | Freq: Once | INTRAMUSCULAR | Status: AC
  Administered 2016-02-07: 4 mg via INTRAVENOUS

## 2016-02-07 MED ORDER — FENTANYL CITRATE (PF) 100 MCG/2ML IJ SOLN
25.0000 ug | INTRAMUSCULAR | Status: DC | PRN
  Administered 2016-02-07 (×2): 50 ug via INTRAVENOUS
  Filled 2016-02-07: qty 2

## 2016-02-07 MED ORDER — GLYCOPYRROLATE 0.2 MG/ML IJ SOLN
INTRAMUSCULAR | Status: AC
  Filled 2016-02-07: qty 1

## 2016-02-07 MED ORDER — PROPOFOL 10 MG/ML IV BOLUS
INTRAVENOUS | Status: AC
  Filled 2016-02-07: qty 20

## 2016-02-07 MED ORDER — NEOSTIGMINE METHYLSULFATE 10 MG/10ML IV SOLN
INTRAVENOUS | Status: AC
  Filled 2016-02-07: qty 1

## 2016-02-07 MED ORDER — MIDAZOLAM HCL 2 MG/2ML IJ SOLN
1.0000 mg | INTRAMUSCULAR | Status: DC | PRN
  Administered 2016-02-07: 2 mg via INTRAVENOUS

## 2016-02-07 MED ORDER — DEXTROSE 5 % IV SOLN
2.0000 g | INTRAVENOUS | Status: AC
  Administered 2016-02-07: 2 g via INTRAVENOUS
  Filled 2016-02-07: qty 2

## 2016-02-07 MED ORDER — EPHEDRINE SULFATE 50 MG/ML IJ SOLN
INTRAMUSCULAR | Status: DC | PRN
  Administered 2016-02-07: 10 mg via INTRAVENOUS

## 2016-02-07 MED ORDER — GLYCOPYRROLATE 0.2 MG/ML IJ SOLN
INTRAMUSCULAR | Status: DC | PRN
  Administered 2016-02-07: .7 mg via INTRAVENOUS

## 2016-02-07 MED ORDER — SODIUM CHLORIDE 0.9 % IV SOLN: INTRAVENOUS | Status: DC

## 2016-02-07 MED ORDER — MIDAZOLAM HCL 2 MG/2ML IJ SOLN
INTRAMUSCULAR | Status: AC
  Filled 2016-02-07: qty 2

## 2016-02-07 MED ORDER — TRAMADOL HCL 50 MG PO TABS: 50.0000 mg | ORAL_TABLET | Freq: Four times a day (QID) | ORAL | 0 refills | Status: DC | PRN

## 2016-02-07 MED ORDER — FENTANYL CITRATE (PF) 250 MCG/5ML IJ SOLN
INTRAMUSCULAR | Status: AC
  Filled 2016-02-07: qty 5

## 2016-02-07 MED ORDER — DIATRIZOATE MEGLUMINE 30 % UR SOLN
URETHRAL | Status: AC
  Filled 2016-02-07: qty 300

## 2016-02-07 MED ORDER — LACTATED RINGERS IV SOLN
INTRAVENOUS | Status: DC
  Administered 2016-02-07: 11:00:00 via INTRAVENOUS

## 2016-02-07 MED ORDER — ROCURONIUM BROMIDE 50 MG/5ML IV SOLN
INTRAVENOUS | Status: AC
  Filled 2016-02-07: qty 1

## 2016-02-07 MED ORDER — PROPOFOL 10 MG/ML IV BOLUS
INTRAVENOUS | Status: DC | PRN
  Administered 2016-02-07: 150 mg via INTRAVENOUS

## 2016-02-07 MED ORDER — SODIUM CHLORIDE 0.9 % IR SOLN
Status: DC | PRN
  Administered 2016-02-07: 3000 mL

## 2016-02-07 MED ORDER — LIDOCAINE HCL (PF) 1 % IJ SOLN
INTRAMUSCULAR | Status: AC
  Filled 2016-02-07: qty 10

## 2016-02-07 MED ORDER — SUCCINYLCHOLINE 20MG/ML (10ML) SYRINGE FOR MEDFUSION PUMP - OPTIME
INTRAMUSCULAR | Status: DC | PRN
  Administered 2016-02-07: 120 mg via INTRAVENOUS

## 2016-02-07 MED ORDER — GLYCOPYRROLATE 0.2 MG/ML IJ SOLN
INTRAMUSCULAR | Status: AC
  Filled 2016-02-07: qty 3

## 2016-02-07 MED ORDER — NEOSTIGMINE METHYLSULFATE 10 MG/10ML IV SOLN
INTRAVENOUS | Status: DC | PRN
  Administered 2016-02-07: 4 mg via INTRAVENOUS

## 2016-02-07 MED ORDER — SUCCINYLCHOLINE CHLORIDE 20 MG/ML IJ SOLN
INTRAMUSCULAR | Status: AC
  Filled 2016-02-07: qty 1

## 2016-02-07 MED ORDER — STERILE WATER FOR IRRIGATION IR SOLN
Status: DC | PRN
  Administered 2016-02-07: 3000 mL

## 2016-02-07 MED ORDER — LIDOCAINE HCL (CARDIAC) 10 MG/ML IV SOLN
INTRAVENOUS | Status: DC | PRN
  Administered 2016-02-07: 40 mg via INTRAVENOUS

## 2016-02-07 MED ORDER — ONDANSETRON HCL 4 MG/2ML IJ SOLN
INTRAMUSCULAR | Status: AC
  Filled 2016-02-07: qty 2

## 2016-02-07 SURGICAL SUPPLY — 46 items
BAG DRAIN URO TABLE W/ADPT NS (DRAPE) ×4 IMPLANT
BAG DRN 8 ADPR NS SKTRN CSTL (DRAPE) ×2
BAG DRN URN TUBE DRIP CHMBR (OSTOMY)
BAG HAMPER (MISCELLANEOUS) ×4 IMPLANT
BAG URINE DRAIN TURP 4L (OSTOMY) ×1 IMPLANT
BAG URINE DRAINAGE (UROLOGICAL SUPPLIES) ×3 IMPLANT
CATH FOLEY 2WAY SLVR  5CC 22FR (CATHETERS)
CATH FOLEY 2WAY SLVR 30CC 20FR (CATHETERS) IMPLANT
CATH FOLEY 2WAY SLVR 30CC 22FR (CATHETERS) IMPLANT
CATH FOLEY 2WAY SLVR 5CC 22FR (CATHETERS) IMPLANT
CATH FOLEY 3WAY 30CC 22F (CATHETERS) ×3 IMPLANT
CATH FOLEY LATEX FREE 22FR (CATHETERS)
CATH FOLEY LF 22FR (CATHETERS) ×1 IMPLANT
CATH INTERMIT  6FR 70CM (CATHETERS) ×4 IMPLANT
CLOTH BEACON ORANGE TIMEOUT ST (SAFETY) ×4 IMPLANT
DECANTER SPIKE VIAL GLASS SM (MISCELLANEOUS) ×4 IMPLANT
ELECT LOOP 22F BIPOLAR SML (ELECTROSURGICAL)
ELECT REM PT RETURN 9FT ADLT (ELECTROSURGICAL) ×4
ELECTRODE LOOP 22F BIPOLAR SML (ELECTROSURGICAL) ×1 IMPLANT
ELECTRODE REM PT RTRN 9FT ADLT (ELECTROSURGICAL) ×2 IMPLANT
EVACUATOR MICROVAS BLADDER (UROLOGICAL SUPPLIES) IMPLANT
EXTRACTOR STONE NITINOL NGAGE (UROLOGICAL SUPPLIES) IMPLANT
GLOVE BIO SURGEON STRL SZ8 (GLOVE) ×4 IMPLANT
GLOVE BIOGEL PI IND STRL 6.5 (GLOVE) ×1 IMPLANT
GLOVE BIOGEL PI INDICATOR 6.5 (GLOVE) ×2
GLOVE EXAM NITRILE MD LF STRL (GLOVE) ×3 IMPLANT
GLOVE SURG SS PI 6.5 STRL IVOR (GLOVE) ×4 IMPLANT
GOWN STRL REUS W/ TWL LRG LVL3 (GOWN DISPOSABLE) ×2 IMPLANT
GOWN STRL REUS W/ TWL XL LVL3 (GOWN DISPOSABLE) ×2 IMPLANT
GOWN STRL REUS W/TWL LRG LVL3 (GOWN DISPOSABLE) ×8 IMPLANT
GOWN STRL REUS W/TWL XL LVL3 (GOWN DISPOSABLE) ×4
GUIDEWIRE STR DUAL SENSOR (WIRE) ×1 IMPLANT
GUIDEWIRE STR ZIPWIRE 035X150 (MISCELLANEOUS) ×4 IMPLANT
IV NS IRRIG 3000ML ARTHROMATIC (IV SOLUTION) ×4 IMPLANT
KIT ROOM TURNOVER AP CYSTO (KITS) ×4 IMPLANT
MANIFOLD NEPTUNE II (INSTRUMENTS) ×4 IMPLANT
PACK CYSTO (CUSTOM PROCEDURE TRAY) ×4 IMPLANT
PAD ARMBOARD 7.5X6 YLW CONV (MISCELLANEOUS) ×4 IMPLANT
PLUG CATH AND CAP STER (CATHETERS) ×3 IMPLANT
STENT URET 6FRX26 CONTOUR (STENTS) IMPLANT
SYR 20CC LL (SYRINGE) IMPLANT
SYR 30ML LL (SYRINGE) ×3 IMPLANT
SYRINGE 10CC LL (SYRINGE) ×4 IMPLANT
SYRINGE IRR TOOMEY STRL 70CC (SYRINGE) IMPLANT
TOWEL OR 17X26 4PK STRL BLUE (TOWEL DISPOSABLE) ×4 IMPLANT
WATER STERILE IRR 3000ML UROMA (IV SOLUTION) ×3 IMPLANT

## 2016-02-07 NOTE — Op Note (Signed)
Preoperative diagnosis: high grade bladder cancer  Postoperative diagnosis: Same  Procedure: 1 cystoscopy 2. Bladder biopsy with fulgeration  Attending: Nicolette Bang  Anesthesia: General  Estimated blood loss: Minimal  Drains: 22 French foley  Specimens: Bladder biopsies x 3  Antibiotics: rocephin  Findings: right lateral wall resection site with fibrinous debris covering resection bed. Right ureteral stent in place  Indications: Patient is a 82 year old male with a history of high grade bladder cancer and is here for repeat resection prior t BCG therapy. After discussing treatment options, they decided proceed with bladder biopsy.  Procedure her in detail: The patient was brought to the operating room and a brief timeout was done to ensure correct patient, correct procedure, correct site. General anesthesia was administered patient was placed in dorsal lithotomy position. Their genitalia was then prepped and draped in usual sterile fashion. A rigid 73 French cystoscope was passed in the urethra and the bladder. Bladder was inspected and we noted the right lateral wall resection site with fibrinous debris covering resection bed. We then used the cold cup biopsy forceps to obtain 3 biopsies from the tumor bed. We then used electrocautery to obtain hemostasis.  the bladder was then drained, a 22 French foley was placed and this concluded the procedure which was well tolerated by patient.  Complications: None  Condition: Stable, extubated, transferred to PACU  Plan: Patient will be discharged home and will followup in 2 days for voiding trial

## 2016-02-07 NOTE — Discharge Instructions (Signed)
Cystoscopy, Care After  Refer to this sheet in the next few weeks. These instructions provide you with information about caring for yourself after your procedure. Your health care provider may also give you more specific instructions. Your treatment has been planned according to current medical practices, but problems sometimes occur. Call your health care provider if you have any problems or questions after your procedure.  What can I expect after the procedure?  After the procedure, it is common to have:   Mild pain when you urinate. Pain should stop within a few minutes after you urinate. This may last for up to 1 week.   A small amount of blood in your urine for several days.   Feeling like you need to urinate but producing only a small amount of urine.    Follow these instructions at home:       Medicines    Take over-the-counter and prescription medicines only as told by your health care provider.   If you were prescribed an antibiotic medicine, take it as told by your health care provider. Do not stop taking the antibiotic even if you start to feel better.  General instructions        Return to your normal activities as told by your health care provider. Ask your health care provider what activities are safe for you.   Do not drive for 24 hours if you received a sedative.   Watch for any blood in your urine. If the amount of blood in your urine increases, call your health care provider.   Follow instructions from your health care provider about eating or drinking restrictions.   If a tissue sample was removed for testing (biopsy) during your procedure, it is your responsibility to get your test results. Ask your health care provider or the department performing the test when your results will be ready.   Drink enough fluid to keep your urine clear or pale yellow.   Keep all follow-up visits as told by your health care provider. This is important.  Contact a health care provider if:   You have  pain that gets worse or does not get better with medicine, especially pain when you urinate.   You have difficulty urinating.  Get help right away if:   You have more blood in your urine.   You have blood clots in your urine.   You have abdominal pain.   You have a fever or chills.   You are unable to urinate.  This information is not intended to replace advice given to you by your health care provider. Make sure you discuss any questions you have with your health care provider.  Document Released: 08/24/2004 Document Revised: 07/13/2015 Document Reviewed: 12/22/2014  Elsevier Interactive Patient Education  2017 [employer]

## 2016-02-07 NOTE — Anesthesia Procedure Notes (Signed)
Procedure Name: Intubation Date/Time: 02/07/2016 11:58 AM Performed by: Tressie Stalker E Pre-anesthesia Checklist: Patient identified, Patient being monitored, Timeout performed, Emergency Drugs available and Suction available Patient Re-evaluated:Patient Re-evaluated prior to inductionOxygen Delivery Method: Circle system utilized Preoxygenation: Pre-oxygenation with 100% oxygen Intubation Type: IV induction Ventilation: Mask ventilation without difficulty Laryngoscope Size: Mac and 3 Grade View: Grade I Tube type: Oral Tube size: 7.0 mm Number of attempts: 1 Airway Equipment and Method: Stylet Placement Confirmation: ETT inserted through vocal cords under direct vision,  positive ETCO2 and breath sounds checked- equal and bilateral Secured at: 21 cm Tube secured with: Tape Dental Injury: Teeth and Oropharynx as per pre-operative assessment

## 2016-02-07 NOTE — Transfer of Care (Signed)
Immediate Anesthesia Transfer of Care Note  Patient: Scott Harvey  Procedure(s) Performed: Procedure(s): CYSTOSCOPY WITH RIGHT RETROGRADE PYELOGRAM (Right) CYSTOSCOPY WITH FULGERATION AND BLADDER BIOPSY (N/A)  Patient Location: PACU  Anesthesia Type:General  Level of Consciousness: awake and alert   Airway & Oxygen Therapy: Patient Spontanous Breathing and Patient connected to face mask oxygen  Post-op Assessment: Report given to RN  Post vital signs: Reviewed and stable  Last Vitals:  Vitals:   02/07/16 1050 02/07/16 1100  BP: 140/87 131/79  Resp: 14 15  Temp:  36.7 C    Last Pain:  Vitals:   02/07/16 1100  TempSrc: Oral  PainSc: 0-No pain         Complications: No apparent anesthesia complications

## 2016-02-07 NOTE — H&P (Signed)
Urology Admission H&P  Chief Complaint: gross hematuria  History of Present Illness: [name] is a 82yo with high grade bladder cancer and a solitary kidney her for repeat TURBT  Past Medical History:  Diagnosis Date  . Arthritis   . Bladder cancer ([place])   . CKD (chronic kidney disease), stage III   . Dry skin    generalized itching ?d/t kidney disease  . H/O: GI bleed    d/t ASA  . History of renal cell carcinoma    1980's left nephrectomy  . HOH (hard of hearing)    right ear better than left  . Macular degeneration of both eyes   . Renal cyst, right   . Solitary right kidney    acquired-- s/p left nephrectomy 1980's   Past Surgical History:  Procedure Laterality Date  . APPENDECTOMY  1954  . CATARACT EXTRACTION W/ INTRAOCULAR LENS  IMPLANT, BILATERAL  2012  . CHOLECYSTECTOMY  1980  . CYSTOSCOPY WITH RETROGRADE PYELOGRAM, URETEROSCOPY AND STENT PLACEMENT Right 01/01/2016   Procedure: CYSTOSCOPY WITH RETROGRADE PYELOGRAM/URETERAL STENT PLACEMENT;  Surgeon: Cleon Gustin, MD;  Location: [facility];  Service: Urology;  Laterality: Right;  . CYSTOSCOPY WITH URETHRAL DILATATION N/A 01/01/2016   Procedure: CYSTOSCOPY WITH URETHRAL DILATATION;  Surgeon: Cleon Gustin, MD;  Location: [facility];  Service: Urology;  Laterality: N/A;  . TOTAL NEPHRECTOMY Left 1980's   renal cell carcinoma  . TRANSURETHRAL RESECTION OF BLADDER TUMOR N/A 01/01/2016   Procedure: TRANSURETHRAL RESECTION OF BLADDER TUMOR (TURBT);  Surgeon: Cleon Gustin, MD;  Location: [facility];  Service: Urology;  Laterality: N/A;    Home Medications:  Prescriptions Prior to Admission  Medication Sig Dispense Refill Last Dose  . acetaminophen (TYLENOL) 325 MG tablet Take 650 mg by mouth every 6 (six) hours as needed for mild pain.    Past Week at Unknown time  . loratadine (CLARITIN) 10 MG tablet Take 10 mg by mouth daily.   02/07/2016 at 0600  .  traMADol (ULTRAM) 50 MG tablet Take 1 tablet (50 mg total) by mouth every 6 (six) hours as needed. (Patient taking differently: Take 50 mg by mouth every 6 (six) hours as needed for moderate pain. ) 30 tablet 0 Past Week at Unknown time   Allergies: No Known Allergies  History reviewed. No pertinent family history. Social History:  reports that he quit smoking about 10 years ago. His smoking use included Pipe. He has never used smokeless tobacco. He reports that he does not drink alcohol or use drugs.  Review of Systems  All other systems reviewed and are negative.   Physical Exam:  Vital signs in last 24 hours: Temp:  [98 F (36.7 C)] 98 F (36.7 C) (12/20 1100) Resp:  [14-20] 15 (12/20 1100) BP: (131-140)/(79-87) 131/79 (12/20 1100) SpO2:  [95 %-96 %] 95 % (12/20 1100) Physical Exam  Constitutional: He is oriented to person, place, and time. He appears well-developed and well-nourished.  HENT:  Head: Normocephalic and atraumatic.  Eyes: EOM are normal. Pupils are equal, round, and reactive to light.  Neck: Normal range of motion. No thyromegaly present.  Cardiovascular: Normal rate and regular rhythm.   Respiratory: Effort normal. No respiratory distress.  GI: Soft. He exhibits no distension.  Musculoskeletal: Normal range of motion. He exhibits no edema.  Neurological: He is alert and oriented to person, place, and time.  Skin: Skin is warm and dry.  Psychiatric: He has a normal  mood and affect. His behavior is normal. Judgment and thought content normal.    Laboratory Data:  Results for orders placed or performed during the hospital encounter of 02/07/16 (from the past 24 hour(s))  Basic metabolic panel     Status: Abnormal   Collection Time: 02/07/16 10:45 AM  Result Value Ref Range   Sodium 139 135 - 145 mmol/L   Potassium 4.6 3.5 - 5.1 mmol/L   Chloride 113 (H) 101 - 111 mmol/L   CO2 20 (L) 22 - 32 mmol/L   Glucose, Bld 97 65 - 99 mg/dL   BUN 24 (H) 6 - 20 mg/dL    Creatinine, Ser 1.64 (H) 0.61 - 1.24 mg/dL   Calcium 8.7 (L) 8.9 - 10.3 mg/dL   GFR calc non Af Amer 37 (L) >60 mL/min   GFR calc Af Amer 42 (L) >60 mL/min   Anion gap 6 5 - 15   No results found for this or any previous visit (from the past 240 hour(s)). Creatinine:  Recent Labs  02/07/16 1045  CREATININE 1.64*   Baseline Creatinine: 1.65  Impression/Assessment:  82yo with high grade bladder cancer  Plan:  The risks/benefits/alternatives to TURBT was expalined to the patient and he understands and wishes to proceed with surgery  Nicolette Bang 02/07/2016, 11:41 AM

## 2016-02-07 NOTE — Anesthesia Postprocedure Evaluation (Signed)
Anesthesia Post Note  Patient: NAVRAJ DREIBELBIS  Procedure(s) Performed: Procedure(s) (LRB): CYSTOSCOPY WITH FULGERATION AND BLADDER BIOPSY (N/A)  Patient location during evaluation: PACU Anesthesia Type: General Level of consciousness: awake and alert and oriented Pain management: pain level controlled Vital Signs Assessment: post-procedure vital signs reviewed and stable Respiratory status: spontaneous breathing Cardiovascular status: blood pressure returned to baseline Postop Assessment: no signs of nausea or vomiting Anesthetic complications: no     Last Vitals:  Vitals:   02/07/16 1310 02/07/16 1316  BP: 138/83 (!) 149/81  Pulse: 60 61  Resp: 15 15  Temp:  36.4 C    Last Pain:  Vitals:   02/07/16 1316  TempSrc: Oral  PainSc: 2                  Carrin Vannostrand

## 2016-02-07 NOTE — Anesthesia Preprocedure Evaluation (Signed)
Anesthesia Evaluation  Patient identified by MRN, date of birth, ID band Patient awake    Reviewed: Allergy & Precautions, NPO status , Patient's Chart, lab work & pertinent test results  Airway Mallampati: II  TM Distance: >3 FB     Dental  (+) Teeth Intact   Pulmonary former smoker,    breath sounds clear to auscultation       Cardiovascular negative cardio ROS   Rhythm:Regular Rate:Normal     Neuro/Psych    GI/Hepatic negative GI ROS,   Endo/Other    Renal/GU Renal InsufficiencyRenal disease     Musculoskeletal   Abdominal   Peds  Hematology   Anesthesia Other Findings   Reproductive/Obstetrics                             Anesthesia Physical Anesthesia Plan  ASA: II  Anesthesia Plan: General   Post-op Pain Management:    Induction: Intravenous  Airway Management Planned: Oral ETT  Additional Equipment:   Intra-op Plan:   Post-operative Plan: Extubation in OR  Informed Consent: I have reviewed the patients History and Physical, chart, labs and discussed the procedure including the risks, benefits and alternatives for the proposed anesthesia with the patient or authorized representative who has indicated his/her understanding and acceptance.     Plan Discussed with:   Anesthesia Plan Comments:         Anesthesia Quick Evaluation

## 2016-02-09 ENCOUNTER — Encounter (HOSPITAL_COMMUNITY): Payer: Self-pay | Admitting: Urology

## 2016-02-09 ENCOUNTER — Ambulatory Visit (INDEPENDENT_AMBULATORY_CARE_PROVIDER_SITE_OTHER): Payer: Commercial Managed Care - HMO | Admitting: Urology

## 2016-02-09 DIAGNOSIS — C678 Malignant neoplasm of overlapping sites of bladder: Secondary | ICD-10-CM | POA: Diagnosis not present

## 2016-02-09 DIAGNOSIS — C672 Malignant neoplasm of lateral wall of bladder: Secondary | ICD-10-CM

## 2016-02-16 ENCOUNTER — Ambulatory Visit (INDEPENDENT_AMBULATORY_CARE_PROVIDER_SITE_OTHER): Payer: Commercial Managed Care - HMO | Admitting: Urology

## 2016-02-16 DIAGNOSIS — C678 Malignant neoplasm of overlapping sites of bladder: Secondary | ICD-10-CM | POA: Diagnosis not present

## 2016-02-16 DIAGNOSIS — C672 Malignant neoplasm of lateral wall of bladder: Secondary | ICD-10-CM | POA: Diagnosis not present

## 2016-03-08 ENCOUNTER — Ambulatory Visit (INDEPENDENT_AMBULATORY_CARE_PROVIDER_SITE_OTHER): Payer: Medicare HMO | Admitting: Urology

## 2016-03-08 DIAGNOSIS — C678 Malignant neoplasm of overlapping sites of bladder: Secondary | ICD-10-CM

## 2016-03-08 DIAGNOSIS — C672 Malignant neoplasm of lateral wall of bladder: Secondary | ICD-10-CM

## 2016-03-15 ENCOUNTER — Ambulatory Visit (INDEPENDENT_AMBULATORY_CARE_PROVIDER_SITE_OTHER): Payer: Medicare HMO | Admitting: Urology

## 2016-03-15 DIAGNOSIS — C678 Malignant neoplasm of overlapping sites of bladder: Secondary | ICD-10-CM

## 2016-03-15 DIAGNOSIS — C672 Malignant neoplasm of lateral wall of bladder: Secondary | ICD-10-CM | POA: Diagnosis not present

## 2016-03-27 ENCOUNTER — Other Ambulatory Visit (HOSPITAL_COMMUNITY)
Admission: RE | Admit: 2016-03-27 | Discharge: 2016-03-27 | Disposition: A | Discharge status: Home / Self Care | Payer: Medicare HMO | Source: Other Acute Inpatient Hospital | Attending: Urology | Admitting: Urology

## 2016-03-27 ENCOUNTER — Ambulatory Visit (INDEPENDENT_AMBULATORY_CARE_PROVIDER_SITE_OTHER): Payer: Medicare HMO | Admitting: Urology

## 2016-03-27 DIAGNOSIS — C672 Malignant neoplasm of lateral wall of bladder: Secondary | ICD-10-CM

## 2016-03-27 DIAGNOSIS — R310 Gross hematuria: Secondary | ICD-10-CM | POA: Diagnosis not present

## 2016-03-30 LAB — URINE CULTURE

## 2016-04-05 ENCOUNTER — Ambulatory Visit (INDEPENDENT_AMBULATORY_CARE_PROVIDER_SITE_OTHER): Payer: Medicare HMO | Admitting: Urology

## 2016-04-05 DIAGNOSIS — C672 Malignant neoplasm of lateral wall of bladder: Secondary | ICD-10-CM

## 2016-04-12 ENCOUNTER — Ambulatory Visit (INDEPENDENT_AMBULATORY_CARE_PROVIDER_SITE_OTHER): Payer: Medicare HMO | Admitting: Urology

## 2016-04-12 DIAGNOSIS — C672 Malignant neoplasm of lateral wall of bladder: Secondary | ICD-10-CM

## 2016-04-18 DIAGNOSIS — M109 Gout, unspecified: Secondary | ICD-10-CM | POA: Diagnosis not present

## 2016-04-18 DIAGNOSIS — L679 Hair color and hair shaft abnormality, unspecified: Secondary | ICD-10-CM | POA: Diagnosis not present

## 2016-04-18 DIAGNOSIS — E669 Obesity, unspecified: Secondary | ICD-10-CM | POA: Diagnosis not present

## 2016-04-18 DIAGNOSIS — N184 Chronic kidney disease, stage 4 (severe): Secondary | ICD-10-CM | POA: Diagnosis not present

## 2016-04-19 ENCOUNTER — Ambulatory Visit (INDEPENDENT_AMBULATORY_CARE_PROVIDER_SITE_OTHER): Payer: Medicare HMO | Admitting: Urology

## 2016-04-19 DIAGNOSIS — C672 Malignant neoplasm of lateral wall of bladder: Secondary | ICD-10-CM | POA: Diagnosis not present

## 2016-04-24 ENCOUNTER — Ambulatory Visit (INDEPENDENT_AMBULATORY_CARE_PROVIDER_SITE_OTHER): Payer: Medicare HMO | Admitting: Urology

## 2016-04-24 DIAGNOSIS — C672 Malignant neoplasm of lateral wall of bladder: Secondary | ICD-10-CM

## 2016-05-15 ENCOUNTER — Ambulatory Visit (INDEPENDENT_AMBULATORY_CARE_PROVIDER_SITE_OTHER): Payer: Medicare HMO | Admitting: Urology

## 2016-05-15 DIAGNOSIS — C678 Malignant neoplasm of overlapping sites of bladder: Secondary | ICD-10-CM | POA: Diagnosis not present

## 2016-05-15 DIAGNOSIS — C672 Malignant neoplasm of lateral wall of bladder: Secondary | ICD-10-CM

## 2016-08-28 ENCOUNTER — Ambulatory Visit (INDEPENDENT_AMBULATORY_CARE_PROVIDER_SITE_OTHER): Payer: Medicare HMO | Admitting: Urology

## 2016-08-28 DIAGNOSIS — C672 Malignant neoplasm of lateral wall of bladder: Secondary | ICD-10-CM | POA: Diagnosis not present

## 2016-08-28 DIAGNOSIS — C678 Malignant neoplasm of overlapping sites of bladder: Secondary | ICD-10-CM | POA: Diagnosis not present

## 2016-10-24 DIAGNOSIS — E669 Obesity, unspecified: Secondary | ICD-10-CM | POA: Diagnosis not present

## 2016-10-24 DIAGNOSIS — M109 Gout, unspecified: Secondary | ICD-10-CM | POA: Diagnosis not present

## 2016-10-24 DIAGNOSIS — M069 Rheumatoid arthritis, unspecified: Secondary | ICD-10-CM | POA: Diagnosis not present

## 2016-10-24 DIAGNOSIS — N184 Chronic kidney disease, stage 4 (severe): Secondary | ICD-10-CM | POA: Diagnosis not present

## 2016-11-27 DIAGNOSIS — C672 Malignant neoplasm of lateral wall of bladder: Secondary | ICD-10-CM | POA: Diagnosis not present

## 2016-12-04 ENCOUNTER — Ambulatory Visit (INDEPENDENT_AMBULATORY_CARE_PROVIDER_SITE_OTHER): Payer: Medicare HMO | Admitting: Urology

## 2016-12-04 DIAGNOSIS — C678 Malignant neoplasm of overlapping sites of bladder: Secondary | ICD-10-CM | POA: Diagnosis not present

## 2016-12-04 DIAGNOSIS — C672 Malignant neoplasm of lateral wall of bladder: Secondary | ICD-10-CM

## 2017-01-29 DIAGNOSIS — Z0000 Encounter for general adult medical examination without abnormal findings: Secondary | ICD-10-CM | POA: Diagnosis not present

## 2017-01-31 DIAGNOSIS — N184 Chronic kidney disease, stage 4 (severe): Secondary | ICD-10-CM | POA: Diagnosis not present

## 2017-01-31 DIAGNOSIS — M109 Gout, unspecified: Secondary | ICD-10-CM | POA: Diagnosis not present

## 2017-01-31 DIAGNOSIS — C679 Malignant neoplasm of bladder, unspecified: Secondary | ICD-10-CM | POA: Diagnosis not present

## 2017-01-31 DIAGNOSIS — M069 Rheumatoid arthritis, unspecified: Secondary | ICD-10-CM | POA: Diagnosis not present

## 2017-01-31 DIAGNOSIS — Z0000 Encounter for general adult medical examination without abnormal findings: Secondary | ICD-10-CM | POA: Diagnosis not present

## 2017-01-31 DIAGNOSIS — Z85528 Personal history of other malignant neoplasm of kidney: Secondary | ICD-10-CM | POA: Diagnosis not present

## 2017-01-31 DIAGNOSIS — E669 Obesity, unspecified: Secondary | ICD-10-CM | POA: Diagnosis not present

## 2017-03-12 ENCOUNTER — Ambulatory Visit: Payer: Medicare HMO | Admitting: Urology

## 2017-03-12 DIAGNOSIS — C672 Malignant neoplasm of lateral wall of bladder: Secondary | ICD-10-CM

## 2017-03-12 DIAGNOSIS — C678 Malignant neoplasm of overlapping sites of bladder: Secondary | ICD-10-CM | POA: Diagnosis not present

## 2017-06-11 ENCOUNTER — Ambulatory Visit: Payer: Medicare HMO | Admitting: Urology

## 2017-06-11 DIAGNOSIS — C678 Malignant neoplasm of overlapping sites of bladder: Secondary | ICD-10-CM

## 2017-06-11 DIAGNOSIS — C672 Malignant neoplasm of lateral wall of bladder: Secondary | ICD-10-CM | POA: Diagnosis not present

## 2017-09-17 ENCOUNTER — Ambulatory Visit: Payer: Medicare HMO | Admitting: Urology

## 2017-09-17 DIAGNOSIS — C672 Malignant neoplasm of lateral wall of bladder: Secondary | ICD-10-CM | POA: Diagnosis not present

## 2017-09-17 DIAGNOSIS — C678 Malignant neoplasm of overlapping sites of bladder: Secondary | ICD-10-CM | POA: Diagnosis not present

## 2017-10-31 DIAGNOSIS — M064 Inflammatory polyarthropathy: Secondary | ICD-10-CM | POA: Diagnosis not present

## 2017-10-31 DIAGNOSIS — M109 Gout, unspecified: Secondary | ICD-10-CM | POA: Diagnosis not present

## 2017-10-31 DIAGNOSIS — Z23 Encounter for immunization: Secondary | ICD-10-CM | POA: Diagnosis not present

## 2017-10-31 DIAGNOSIS — E669 Obesity, unspecified: Secondary | ICD-10-CM | POA: Diagnosis not present

## 2017-10-31 DIAGNOSIS — N184 Chronic kidney disease, stage 4 (severe): Secondary | ICD-10-CM | POA: Diagnosis not present

## 2017-12-24 ENCOUNTER — Ambulatory Visit: Payer: Medicare HMO | Admitting: Urology

## 2017-12-24 DIAGNOSIS — C678 Malignant neoplasm of overlapping sites of bladder: Secondary | ICD-10-CM

## 2017-12-24 DIAGNOSIS — R351 Nocturia: Secondary | ICD-10-CM | POA: Diagnosis not present

## 2017-12-24 DIAGNOSIS — C672 Malignant neoplasm of lateral wall of bladder: Secondary | ICD-10-CM | POA: Diagnosis not present

## 2018-02-03 DIAGNOSIS — Z0000 Encounter for general adult medical examination without abnormal findings: Secondary | ICD-10-CM | POA: Diagnosis not present

## 2018-02-05 DIAGNOSIS — M109 Gout, unspecified: Secondary | ICD-10-CM | POA: Diagnosis not present

## 2018-02-05 DIAGNOSIS — Z85528 Personal history of other malignant neoplasm of kidney: Secondary | ICD-10-CM | POA: Diagnosis not present

## 2018-02-05 DIAGNOSIS — E669 Obesity, unspecified: Secondary | ICD-10-CM | POA: Diagnosis not present

## 2018-02-05 DIAGNOSIS — Z0000 Encounter for general adult medical examination without abnormal findings: Secondary | ICD-10-CM | POA: Diagnosis not present

## 2018-02-05 DIAGNOSIS — C679 Malignant neoplasm of bladder, unspecified: Secondary | ICD-10-CM | POA: Diagnosis not present

## 2018-02-05 DIAGNOSIS — M069 Rheumatoid arthritis, unspecified: Secondary | ICD-10-CM | POA: Diagnosis not present

## 2018-02-05 DIAGNOSIS — N184 Chronic kidney disease, stage 4 (severe): Secondary | ICD-10-CM | POA: Diagnosis not present

## 2018-03-04 DIAGNOSIS — M069 Rheumatoid arthritis, unspecified: Secondary | ICD-10-CM | POA: Diagnosis not present

## 2018-03-04 DIAGNOSIS — T50905A Adverse effect of unspecified drugs, medicaments and biological substances, initial encounter: Secondary | ICD-10-CM | POA: Diagnosis not present

## 2018-03-04 DIAGNOSIS — N184 Chronic kidney disease, stage 4 (severe): Secondary | ICD-10-CM | POA: Diagnosis not present

## 2018-03-04 DIAGNOSIS — E669 Obesity, unspecified: Secondary | ICD-10-CM | POA: Diagnosis not present

## 2018-08-05 DIAGNOSIS — M069 Rheumatoid arthritis, unspecified: Secondary | ICD-10-CM | POA: Diagnosis not present

## 2018-08-05 DIAGNOSIS — H9190 Unspecified hearing loss, unspecified ear: Secondary | ICD-10-CM | POA: Diagnosis not present

## 2018-08-05 DIAGNOSIS — H3530 Unspecified macular degeneration: Secondary | ICD-10-CM | POA: Diagnosis not present

## 2018-09-09 ENCOUNTER — Other Ambulatory Visit (HOSPITAL_COMMUNITY)
Admission: RE | Admit: 2018-09-09 | Discharge: 2018-09-09 | Disposition: A | Discharge status: Home / Self Care | Payer: Medicare HMO | Source: Ambulatory Visit | Attending: Urology | Admitting: Urology

## 2018-09-09 ENCOUNTER — Ambulatory Visit (INDEPENDENT_AMBULATORY_CARE_PROVIDER_SITE_OTHER): Payer: Medicare HMO | Admitting: Urology

## 2018-09-09 DIAGNOSIS — N3000 Acute cystitis without hematuria: Secondary | ICD-10-CM

## 2018-09-10 LAB — URINE CULTURE: Culture: <10000 — AB

## 2018-09-16 ENCOUNTER — Ambulatory Visit (INDEPENDENT_AMBULATORY_CARE_PROVIDER_SITE_OTHER): Payer: Medicare HMO | Admitting: Urology

## 2018-09-16 DIAGNOSIS — C672 Malignant neoplasm of lateral wall of bladder: Secondary | ICD-10-CM | POA: Diagnosis not present

## 2018-10-14 ENCOUNTER — Other Ambulatory Visit (HOSPITAL_COMMUNITY)
Admission: RE | Admit: 2018-10-14 | Discharge: 2018-10-14 | Disposition: A | Discharge status: Home / Self Care | Payer: Medicare HMO | Source: Ambulatory Visit | Attending: Urology | Admitting: Urology

## 2018-10-14 DIAGNOSIS — N3000 Acute cystitis without hematuria: Secondary | ICD-10-CM | POA: Diagnosis not present

## 2018-10-14 LAB — URINALYSIS, ROUTINE W REFLEX MICROSCOPIC
Bacteria, UA: NONE SEEN
Bilirubin Urine: NEGATIVE
Glucose, UA: NEGATIVE mg/dL
Ketones, ur: NEGATIVE mg/dL
Nitrite: NEGATIVE
Protein, ur: 100 mg/dL — AB
Specific Gravity, Urine: 1.016 (ref 1.005–1.030)
WBC, UA: >50 WBC/hpf — ABNORMAL HIGH (ref 0–5)
pH: 5 (ref 5.0–8.0)

## 2018-10-15 LAB — URINE CULTURE: Culture: NO GROWTH

## 2018-11-09 DIAGNOSIS — N401 Enlarged prostate with lower urinary tract symptoms: Secondary | ICD-10-CM | POA: Diagnosis not present

## 2018-11-09 DIAGNOSIS — N184 Chronic kidney disease, stage 4 (severe): Secondary | ICD-10-CM | POA: Diagnosis not present

## 2018-11-09 DIAGNOSIS — M109 Gout, unspecified: Secondary | ICD-10-CM | POA: Diagnosis not present

## 2018-11-09 DIAGNOSIS — Z23 Encounter for immunization: Secondary | ICD-10-CM | POA: Diagnosis not present

## 2018-11-09 DIAGNOSIS — E669 Obesity, unspecified: Secondary | ICD-10-CM | POA: Diagnosis not present

## 2018-11-10 DIAGNOSIS — M109 Gout, unspecified: Secondary | ICD-10-CM | POA: Diagnosis not present

## 2018-11-10 DIAGNOSIS — E669 Obesity, unspecified: Secondary | ICD-10-CM | POA: Diagnosis not present

## 2018-11-10 DIAGNOSIS — N401 Enlarged prostate with lower urinary tract symptoms: Secondary | ICD-10-CM | POA: Diagnosis not present

## 2018-11-10 DIAGNOSIS — N184 Chronic kidney disease, stage 4 (severe): Secondary | ICD-10-CM | POA: Diagnosis not present

## 2018-12-23 ENCOUNTER — Other Ambulatory Visit: Payer: Self-pay | Admitting: *Deleted

## 2018-12-23 DIAGNOSIS — Z20828 Contact with and (suspected) exposure to other viral communicable diseases: Secondary | ICD-10-CM | POA: Diagnosis not present

## 2018-12-23 DIAGNOSIS — Z20822 Contact with and (suspected) exposure to covid-19: Secondary | ICD-10-CM

## 2018-12-24 LAB — NOVEL CORONAVIRUS, NAA: SARS-CoV-2, NAA: NOT DETECTED

## 2019-02-09 ENCOUNTER — Encounter: Payer: Self-pay | Admitting: Urology

## 2019-03-10 ENCOUNTER — Ambulatory Visit (INDEPENDENT_AMBULATORY_CARE_PROVIDER_SITE_OTHER): Payer: Medicare PPO | Admitting: Adult Health Nurse Practitioner

## 2019-03-10 ENCOUNTER — Other Ambulatory Visit: Payer: Self-pay

## 2019-03-10 DIAGNOSIS — B029 Zoster without complications: Secondary | ICD-10-CM

## 2019-03-10 MED ORDER — HYDROCODONE-ACETAMINOPHEN 5-325 MG PO TABS: 1.0000 | ORAL_TABLET | Freq: Four times a day (QID) | ORAL | 0 refills | Status: AC | PRN

## 2019-03-10 MED ORDER — VALACYCLOVIR HCL 1 G PO TABS: 1000.0000 mg | ORAL_TABLET | Freq: Three times a day (TID) | ORAL | 0 refills | Status: DC

## 2019-03-10 MED ORDER — GABAPENTIN 100 MG PO CAPS: ORAL_CAPSULE | ORAL | 0 refills | Status: DC

## 2019-03-10 MED ORDER — PREDNISONE 20 MG PO TABS: ORAL_TABLET | ORAL | 0 refills | Status: DC

## 2019-03-10 MED ORDER — HYDROCODONE-ACETAMINOPHEN 5-325 MG PO TABS: 1.0000 | ORAL_TABLET | Freq: Four times a day (QID) | ORAL | 0 refills | Status: DC | PRN

## 2019-03-10 MED ORDER — VALACYCLOVIR HCL 1 G PO TABS: 1000.0000 mg | ORAL_TABLET | Freq: Three times a day (TID) | ORAL | 0 refills | Status: AC

## 2019-03-10 MED ORDER — HYDROCORTISONE 2 % EX LOTN: TOPICAL_LOTION | CUTANEOUS | 0 refills | Status: DC

## 2019-03-10 NOTE — Patient Instructions (Addendum)
   If you have lab work done today you will be contacted with your lab results within the next 2 weeks.  If you have not heard from us then please contact us. The fastest way to get your results is to register for My Chart.   IF you received an x-ray today, you will receive an invoice from [facility]. Please contact [facility] at [phone] with questions or concerns regarding your invoice.   IF you received labwork today, you will receive an invoice from [facility]. Please contact [facility] at [phone] with questions or concerns regarding your invoice.   Our billing staff will not be able to assist you with questions regarding bills from these companies.  You will be contacted with the lab results as soon as they are available. The fastest way to get your results is to activate your My Chart account. Instructions are located on the last page of this paperwork. If you have not heard from us regarding the results in 2 weeks, please contact this office.      Shingles  Shingles, which is also known as herpes zoster, is an infection that causes a painful skin rash and fluid-filled blisters. It is caused by a virus. Shingles only develops in people who:  Have had chickenpox.  Have been given a medicine to protect against chickenpox (have been vaccinated). Shingles is rare in this group. What are the causes? Shingles is caused by varicella-zoster virus (VZV). This is the same virus that causes chickenpox. After a person is exposed to VZV, the virus stays in the body in an inactive (dormant) state. Shingles develops if the virus is reactivated. This can happen many years after the first (initial) exposure to VZV. It is not known what causes this virus to be reactivated. What increases the risk? People who have had chickenpox or received the chickenpox vaccine are at risk for shingles. Shingles infection is more common in people who:  Are older than age 60.  Have a  weakened disease-fighting system (immune system), such as people with: ? HIV. ? AIDS. ? Cancer.  Are taking medicines that weaken the immune system, such as transplant medicines.  Are experiencing a lot of stress. What are the signs or symptoms? Early symptoms of this condition include itching, tingling, and pain in an area on your skin. Pain may be described as burning, stabbing, or throbbing. A few days or weeks after early symptoms start, a painful red rash appears. The rash is usually on one side of the body and has a band-like or belt-like pattern. The rash eventually turns into fluid-filled blisters that break open, change into scabs, and dry up in about 2-3 weeks. At any time during the infection, you may also develop:  A fever.  Chills.  A headache.  An upset stomach. How is this diagnosed? This condition is diagnosed with a skin exam. Skin or fluid samples may be taken from the blisters before a diagnosis is made. These samples are examined under a microscope or sent to a lab for testing. How is this treated? The rash may last for several weeks. There is not a specific cure for this condition. Your health care provider will probably prescribe medicines to help you manage pain, recover more quickly, and avoid long-term problems. Medicines may include:  Antiviral drugs.  Anti-inflammatory drugs.  Pain medicines.  Anti-itching medicines (antihistamines). If the area involved is on your face, you may be referred to a specialist, such as an eye doctor (  ophthalmologist) or an ear, nose, and throat (ENT) doctor (otolaryngologist) to help you avoid eye problems, chronic pain, or disability. Follow these instructions at home: Medicines  Take over-the-counter and prescription medicines only as told by your health care provider.  Apply an anti-itch cream or numbing cream to the affected area as told by your health care provider. Relieving itching and discomfort   Apply cold,  wet cloths (cold compresses) to the area of the rash or blisters as told by your health care provider.  Cool baths can be soothing. Try adding baking soda or dry oatmeal to the water to reduce itching. Do not bathe in hot water. Blister and rash care  Keep your rash covered with a loose bandage (dressing). Wear loose-fitting clothing to help ease the pain of material rubbing against the rash.  Keep your rash and blisters clean by washing the area with mild soap and cool water as told by your health care provider.  Check your rash every day for signs of infection. Check for: ? More redness, swelling, or pain. ? Fluid or blood. ? Warmth. ? Pus or a bad smell.  Do not scratch your rash or pick at your blisters. To help avoid scratching: ? Keep your fingernails clean and cut short. ? Wear gloves or mittens while you sleep, if scratching is a problem. General instructions  Rest as told by your health care provider.  Keep all follow-up visits as told by your health care provider. This is important.  Wash your hands often with soap and water. If soap and water are not available, use hand sanitizer. Doing this lowers your chance of getting a bacterial skin infection.  Before your blisters change into scabs, your shingles infection can cause chickenpox in people who have never had it or have never been vaccinated against it. To prevent this from happening, avoid contact with other people, especially: ? Babies. ? Pregnant women. ? Children who have eczema. ? Elderly people who have transplants. ? People who have chronic illnesses, such as cancer or AIDS. Contact a health care provider if:  Your pain is not relieved with prescribed medicines.  Your pain does not get better after the rash heals.  You have signs of infection in the rash area, such as: ? More redness, swelling, or pain around the rash. ? Fluid or blood coming from the rash. ? The rash area feeling warm to the touch. ? Pus  or a bad smell coming from the rash. Get help right away if:  The rash is on your face or nose.  You have facial pain, pain around your eye area, or loss of feeling on one side of your face.  You have difficulty seeing.  You have ear pain or have ringing in your ear.  You have a loss of taste.  Your condition gets worse. Summary  Shingles, which is also known as herpes zoster, is an infection that causes a painful skin rash and fluid-filled blisters.  This condition is diagnosed with a skin exam. Skin or fluid samples may be taken from the blisters and examined before the diagnosis is made.  Keep your rash covered with a loose bandage (dressing). Wear loose-fitting clothing to help ease the pain of material rubbing against the rash.  Before your blisters change into scabs, your shingles infection can cause chickenpox in people who have never had it or have never been vaccinated against it. This information is not intended to replace advice given to you by your   health care provider. Make sure you discuss any questions you have with your health care provider. Document Revised: 05/29/2018 Document Reviewed: 10/09/2016 Elsevier Patient Education  2020 Elsevier Inc.  

## 2019-03-11 ENCOUNTER — Encounter: Payer: Self-pay | Admitting: Adult Health Nurse Practitioner

## 2019-03-11 DIAGNOSIS — B029 Zoster without complications: Secondary | ICD-10-CM

## 2019-03-11 HISTORY — DX: Zoster without complications: B02.9

## 2019-03-11 NOTE — Progress Notes (Signed)
Chief Complaint  Patient presents with  . Rash    x2 days in the chest area    HPI  Patient presents with a rash to his left shoulders since Sunday.  It is itchy and painful.  Denies any known recent illness, fever, chills.  He does endorse fatigue.  Rash is located to his left shoulder and extends down to his trapezius on the left as well along the T3 dermatome. He has tried Benadryl cream to the area with minimal relief.   Problem List    Problem List: 2021-01: Shingles   Allergies   has No Known Allergies.  Medications    Current Outpatient Medications:  .  acetaminophen (TYLENOL) 325 MG tablet, Take 650 mg by mouth every 6 (six) hours as needed for mild pain. , Disp: , Rfl:  .  gabapentin (NEURONTIN) 100 MG capsule, 1-2 tabs at night, may increase to 3 if tolerated., Disp: 60 capsule, Rfl: 0 .  HYDROcodone-acetaminophen (NORCO/VICODIN) 5-325 MG tablet, Take 1 tablet by mouth every 6 (six) hours as needed for up to 5 days for moderate pain., Disp: 30 tablet, Rfl: 0 .  HYDROCORTISONE, TOPICAL, 2 % LOTN, Apply to affected area bid, Disp: 59.2 mL, Rfl: 0 .  loratadine (CLARITIN) 10 MG tablet, Take 10 mg by mouth daily., Disp: , Rfl:  .  predniSONE (DELTASONE) 20 MG tablet, 60mg  x 2 days, 40mg  x 3 days, 20mg  x 3, Disp: 15 tablet, Rfl: 0 .  traMADol (ULTRAM) 50 MG tablet, Take 1 tablet (50 mg total) by mouth every 6 (six) hours as needed. (Patient not taking: Reported on 03/10/2019), Disp: 30 tablet, Rfl: 0 .  valACYclovir (VALTREX) 1000 MG tablet, Take 1 tablet (1,000 mg total) by mouth 3 (three) times daily for 7 days., Disp: 21 tablet, Rfl: 0   Review of Systems    Constitutional: Negative for activity change, appetite change, chills and fever.  HENT: Negative for congestion, nosebleeds, trouble swallowing and voice change.   Respiratory: Negative for cough, shortness of breath and wheezing.   Gastrointestinal: Negative for diarrhea, nausea and vomiting.  Genitourinary:  Negative for difficulty urinating, dysuria, flank pain and hematuria.  Musculoskeletal: Negative for back pain, joint swelling and neck pain.  Neurological: Negative for dizziness, speech difficulty, light-headedness and numbness.  Skin:  Positive for rash, pain, pruritius See HPI. All other review of systems negative.            Physical Exam:  BP 110/60 (BP Location: Left Arm, Patient Position: Sitting, Cuff Size: Normal)   Pulse 93   Temp 97.8 F (36.6 C) (Temporal)   Ht 5\' 9"  (1.753 m)   Wt 250 lb 12.8 oz (113.8 kg)   SpO2 93%   BMI 37.04 kg/m   Physical Examination: General appearance - alert, well appearing, and in no distress and oriented to person, place, and time Mental status - normal mood, behavior, speech, dress, motor activity, and thought processes Eyes - PERRL.  EOM. Neck - supple, no significant adenopathy, carotids upstroke normal bilaterally, no bruits, thyroid exam: thyroid is normal in size without nodules or tenderness Chest - clear to auscultation, no wheezes, rales or rhonchi, symmetric air entry  Heart - normal rate, regular rhythm, normal S1, S2, no murmurs, rubs, clicks or gallops Extremities - dependent LE edema without clubbing or cyanosis Skin - Vesicular rash extending from lest chest posteriorly to trapezius and then also following a T3 dermatome.   Lab Review   no lab studies available  for review at time of visit.   Assessment & Plan:  Scott Harvey is a 86 y.o. male . 1. Herpes zoster without complication    No orders of the defined types were placed in this encounter.  Meds ordered this encounter  Medications  . DISCONTD: valACYclovir (VALTREX) 1000 MG tablet    Sig: Take 1 tablet (1,000 mg total) by mouth 3 (three) times daily for 7 days.    Dispense:  21 tablet    Refill:  0  . DISCONTD: predniSONE (DELTASONE) 20 MG tablet    Sig: 60mg  x 2 days, 40mg  x 3 days, 20mg  x 3    Dispense:  15 tablet    Refill:  0  . DISCONTD: gabapentin  (NEURONTIN) 100 MG capsule    Sig: 1-2 tabs at night, may increase to 3 if tolerated.    Dispense:  60 capsule    Refill:  0  . DISCONTD: HYDROcodone-acetaminophen (NORCO/VICODIN) 5-325 MG tablet    Sig: Take 1 tablet by mouth every 6 (six) hours as needed for up to 5 days for moderate pain.    Dispense:  30 tablet    Refill:  0  . DISCONTD: HYDROCORTISONE, TOPICAL, 2 % LOTN    Sig: Apply to affected area bid    Dispense:  59.2 mL    Refill:  0  . gabapentin (NEURONTIN) 100 MG capsule    Sig: 1-2 tabs at night, may increase to 3 if tolerated.    Dispense:  60 capsule    Refill:  0  . HYDROcodone-acetaminophen (NORCO/VICODIN) 5-325 MG tablet    Sig: Take 1 tablet by mouth every 6 (six) hours as needed for up to 5 days for moderate pain.    Dispense:  30 tablet    Refill:  0  . HYDROCORTISONE, TOPICAL, 2 % LOTN    Sig: Apply to affected area bid    Dispense:  59.2 mL    Refill:  0  . predniSONE (DELTASONE) 20 MG tablet    Sig: 60mg  x 2 days, 40mg  x 3 days, 20mg  x 3    Dispense:  15 tablet    Refill:  0  . valACYclovir (VALTREX) 1000 MG tablet    Sig: Take 1 tablet (1,000 mg total) by mouth 3 (three) times daily for 7 days.    Dispense:  21 tablet    Refill:  0   Though Prednisone is not necessary, the area is very inflamed and will try for anti-inflammatory/pain properties for some relief.  Explained uses of medications and side effects of Gabapentin and Hydrocodone APAP.  He is aware not to take the two of these together.  All questions were answered to his satisfaction.     Glyn Ade, NP

## 2019-03-17 ENCOUNTER — Telehealth: Payer: Self-pay | Admitting: Adult Health Nurse Practitioner

## 2019-03-17 NOTE — Telephone Encounter (Signed)
Pt would like a script for Hydroxychloroquine. He says this is for his arthritis. He wasn't sure about the name of the script, he said [facility] uses this script. Please advise at [phone]

## 2019-03-17 NOTE — Telephone Encounter (Signed)
He would like [place] to send his script to [facility] on [address] In [place].

## 2019-03-17 NOTE — Telephone Encounter (Signed)
Please Advise on this 

## 2019-03-19 ENCOUNTER — Other Ambulatory Visit: Payer: Self-pay

## 2019-03-19 ENCOUNTER — Ambulatory Visit: Payer: Medicare PPO | Admitting: Adult Health Nurse Practitioner

## 2019-03-19 ENCOUNTER — Encounter: Payer: Self-pay | Admitting: Adult Health Nurse Practitioner

## 2019-03-19 VITALS — BP 100/60 | HR 90 | Temp 97.8°F | Ht 69.0 in | Wt 252.0 lb

## 2019-03-19 DIAGNOSIS — B029 Zoster without complications: Secondary | ICD-10-CM | POA: Diagnosis not present

## 2019-03-19 MED ORDER — TRIAMCINOLONE ACETONIDE 0.1 % EX CREA: 1.0000 "application " | TOPICAL_CREAM | Freq: Two times a day (BID) | CUTANEOUS | 0 refills | Status: DC

## 2019-03-19 MED ORDER — MUPIROCIN CALCIUM 2 % EX CREA: 1.0000 "application " | TOPICAL_CREAM | Freq: Two times a day (BID) | CUTANEOUS | 0 refills | Status: AC

## 2019-03-19 NOTE — Progress Notes (Signed)
    03/19/2019  Scott Harvey [birth year] [id number]   SUBJECTIVE: Scott Harvey  Presents for follow up of his herpes zoster.  Blisters have drained and scabbed over. Patient is uncomfortable and feels weak to his upper and lower extremities.  He did not get Valtrex or Prednisone this past week but got the Hydrocortisone cream.  He started it today.  He has taken the Gabapentin.  We discussed that it could affect him feeling weak.  He has not been showering when he feels weak as he is concerned about falling.  He is able to eat.  Discussed po water intake which he can increase.    OBJECTIVE: Vital signs are normal, he appears well. Typical zoster lesions noted; vesicles on erythematous bases in clusters on the  Upper left shoulder and chest  Around T3-T4 in a dermatomal pattern.  Small pustules with several white heads appeared in between vesicles.    ASSESSMENT: Herpes Zoster (shingles)  PLAN:  I suggested he wash the area with soap and water given that he may be getting some folliculitis superimposed.  Would then put antibiotic cream or Hydrocortisone to affected area.  Advised to not take Gabapentin tonight and see if that changes his weakness.    The nature of herpes zoster is explained carefully. Lesions should be compressed/soaked with saline, topical antibiotic ointment to any infected lesions; Aloe Vera cream may help minor local pain. Intervention with antiviral agents is extremely helpful early in the course of the disease, less helpful after 2-3 days of symptoms. Postherpetic neuralgia is explained; this may occur especially in the elderly despite every attempt at prevention. The patient understands these issues, and will call as needed for further care.  A total of 25 minutes were spent face-to-face with the patient during this encounter and over half of that time was spent on counseling and coordination of care.   Glyn Ade, NP

## 2019-03-19 NOTE — Patient Instructions (Signed)
° ° ° °  If you have lab work done today you will be contacted with your lab results within the next 2 weeks.  If you have not heard from us then please contact us. The fastest way to get your results is to register for My Chart. ° ° °IF you received an x-ray today, you will receive an invoice from [facility]. Please contact  Radiology at [phone] with questions or concerns regarding your invoice.  ° °IF you received labwork today, you will receive an invoice from [facility]. Please contact [facility] at [phone] with questions or concerns regarding your invoice.  ° °Our billing staff will not be able to assist you with questions regarding bills from these companies. ° °You will be contacted with the lab results as soon as they are available. The fastest way to get your results is to activate your My Chart account. Instructions are located on the last page of this paperwork. If you have not heard from us regarding the results in 2 weeks, please contact this office. °  ° ° ° °

## 2019-03-23 NOTE — Telephone Encounter (Signed)
I would prefer he be over his shingles before prescribing any further medication.  We can discuss at his follow up.  Judson Roch

## 2019-03-23 NOTE — Telephone Encounter (Signed)
Detail msg was left on machine and if he have any further question give our office a call. If he do not have f/u appt he need schedule one

## 2019-04-07 DIAGNOSIS — B0229 Other postherpetic nervous system involvement: Secondary | ICD-10-CM | POA: Diagnosis not present

## 2019-04-23 DIAGNOSIS — B0229 Other postherpetic nervous system involvement: Secondary | ICD-10-CM | POA: Diagnosis not present

## 2019-05-04 DIAGNOSIS — B0229 Other postherpetic nervous system involvement: Secondary | ICD-10-CM | POA: Diagnosis not present

## 2019-05-18 DIAGNOSIS — B0229 Other postherpetic nervous system involvement: Secondary | ICD-10-CM | POA: Diagnosis not present

## 2019-06-04 DIAGNOSIS — M069 Rheumatoid arthritis, unspecified: Secondary | ICD-10-CM | POA: Diagnosis not present

## 2019-06-04 DIAGNOSIS — N400 Enlarged prostate without lower urinary tract symptoms: Secondary | ICD-10-CM | POA: Diagnosis not present

## 2019-06-04 DIAGNOSIS — Z79899 Other long term (current) drug therapy: Secondary | ICD-10-CM | POA: Diagnosis not present

## 2019-06-10 DIAGNOSIS — N390 Urinary tract infection, site not specified: Secondary | ICD-10-CM | POA: Diagnosis not present

## 2019-06-10 DIAGNOSIS — Z0001 Encounter for general adult medical examination with abnormal findings: Secondary | ICD-10-CM | POA: Diagnosis not present

## 2019-06-10 DIAGNOSIS — B0229 Other postherpetic nervous system involvement: Secondary | ICD-10-CM | POA: Diagnosis not present

## 2019-06-10 DIAGNOSIS — C679 Malignant neoplasm of bladder, unspecified: Secondary | ICD-10-CM | POA: Diagnosis not present

## 2019-06-10 DIAGNOSIS — M069 Rheumatoid arthritis, unspecified: Secondary | ICD-10-CM | POA: Diagnosis not present

## 2019-06-10 DIAGNOSIS — N184 Chronic kidney disease, stage 4 (severe): Secondary | ICD-10-CM | POA: Diagnosis not present

## 2019-06-10 DIAGNOSIS — N3000 Acute cystitis without hematuria: Secondary | ICD-10-CM | POA: Diagnosis not present

## 2019-06-25 ENCOUNTER — Other Ambulatory Visit: Payer: Self-pay

## 2019-07-07 ENCOUNTER — Telehealth: Payer: Self-pay | Admitting: Urology

## 2019-07-07 NOTE — Telephone Encounter (Signed)
Pt called and stated he was prescribed afluzosin 10mg 

## 2019-07-08 ENCOUNTER — Other Ambulatory Visit: Payer: Self-pay | Admitting: Urology

## 2019-07-08 ENCOUNTER — Other Ambulatory Visit: Payer: Self-pay

## 2019-07-08 MED ORDER — SILODOSIN 8 MG PO CAPS: 8.0000 mg | ORAL_CAPSULE | Freq: Every day | ORAL | 0 refills | Status: DC

## 2019-07-08 NOTE — Telephone Encounter (Signed)
AS ordered by Dr. Alyson Ingles sent in rx for Rapaflo and pt. Notified.

## 2019-07-08 NOTE — Telephone Encounter (Signed)
LMTRC

## 2019-07-08 NOTE — Telephone Encounter (Signed)
Please switch med to rapaflo 8mg  daily

## 2019-07-08 NOTE — Progress Notes (Unsigned)
rapaflo

## 2019-07-18 ENCOUNTER — Telehealth: Payer: Self-pay | Admitting: Adult Health

## 2019-07-18 NOTE — Telephone Encounter (Signed)
Called and LMOM about COVID19 homebound vaccination program.  I asked that he call us back at his earliest convenience to discuss.    Wilber Bihari, NP

## 2019-07-27 ENCOUNTER — Telehealth: Payer: Self-pay | Admitting: Urology

## 2019-07-27 NOTE — Telephone Encounter (Signed)
Pt left vm concerning his Rx for Silosin. He stated that he cannot afford the medication since his insurance is not covering it. Requests something else.

## 2019-07-30 ENCOUNTER — Telehealth: Payer: Self-pay | Admitting: Urology

## 2019-07-30 ENCOUNTER — Other Ambulatory Visit: Payer: Self-pay | Admitting: Urology

## 2019-07-30 NOTE — Telephone Encounter (Signed)
Pt called requesting a nurse return his call regarding a medication issue

## 2019-07-30 NOTE — Telephone Encounter (Signed)
Pt. Called back from 3 days ago about same issue with med. I explained Dr. Alyson Ingles was on vacation and it would be at least Monday before we heard from him.

## 2019-08-02 NOTE — Telephone Encounter (Signed)
Please see below of pt request for medication change

## 2019-08-04 NOTE — Telephone Encounter (Signed)
Uroxatral 10mg  daily

## 2019-08-04 NOTE — Telephone Encounter (Signed)
The original rx did not have correct diagnosis sent. Ive sent appeal to insurance.

## 2019-08-06 ENCOUNTER — Telehealth: Payer: Self-pay

## 2019-08-06 NOTE — Telephone Encounter (Signed)
Received approval for silodosin Rx. Approval number sent to [facility] pharmacy and pt notified of approval.

## 2019-08-30 ENCOUNTER — Other Ambulatory Visit: Payer: Self-pay

## 2019-08-30 ENCOUNTER — Ambulatory Visit (INDEPENDENT_AMBULATORY_CARE_PROVIDER_SITE_OTHER): Payer: Medicare HMO | Admitting: Urology

## 2019-08-30 ENCOUNTER — Encounter: Payer: Self-pay | Admitting: Urology

## 2019-08-30 VITALS — BP 116/85 | HR 86 | Temp 97.0°F | Ht 69.0 in | Wt 252.0 lb

## 2019-08-30 DIAGNOSIS — C672 Malignant neoplasm of lateral wall of bladder: Secondary | ICD-10-CM

## 2019-08-30 DIAGNOSIS — N401 Enlarged prostate with lower urinary tract symptoms: Secondary | ICD-10-CM

## 2019-08-30 DIAGNOSIS — C678 Malignant neoplasm of overlapping sites of bladder: Secondary | ICD-10-CM | POA: Diagnosis not present

## 2019-08-30 DIAGNOSIS — N138 Other obstructive and reflux uropathy: Secondary | ICD-10-CM

## 2019-08-30 LAB — POCT URINALYSIS DIPSTICK
Bilirubin, UA: NEGATIVE
Glucose, UA: NEGATIVE
Ketones, UA: NEGATIVE
Leukocytes, UA: NEGATIVE
Nitrite, UA: NEGATIVE
Protein, UA: POSITIVE — AB
Spec Grav, UA: 1.02 (ref 1.010–1.025)
Urobilinogen, UA: 0.2 E.U./dL
pH, UA: 5 (ref 5.0–8.0)

## 2019-08-30 MED ORDER — CIPROFLOXACIN HCL 500 MG PO TABS
500.0000 mg | ORAL_TABLET | Freq: Once | ORAL | Status: AC
  Administered 2019-08-30: 17:00:00 500 mg via ORAL

## 2019-08-30 MED ORDER — TAMSULOSIN HCL 0.4 MG PO CAPS: 0.4000 mg | ORAL_CAPSULE | Freq: Every day | ORAL | 3 refills | Status: DC

## 2019-08-30 NOTE — Progress Notes (Signed)

## 2019-08-30 NOTE — Patient Instructions (Signed)

## 2019-08-30 NOTE — Progress Notes (Signed)
   08/30/19  CC: Bladder cancer  HPI: Scott Scott Harvey is a 86yo here for followup for high grade bladder cancer. It has been 1 year since his last cystoscopy. No worsening LUTS. No hematuria or dysuria.  His records from [facility] are as follows. I have bladder cancer. HPI: Scott Harvey is a 86 year-old male established patient who is here for bladder cancer.  His problem was diagnosed approximately 12/29/2015. His bladder cancer was diagnosed by Dr. Alyson Ingles. His cancer was diagnosed at [facility]. The bladder cancer was found because of blood in his urine.   His bladder cancer was treated by removal with scope. Patient denies removal of the entire bladder, radiation, and chemotherapy.   His last cysto was approximately 12/29/2015.   He does have a good appetite. BOWEL HABITS: his bowels are moving normally. He is not having pain in new locations. He has not recently had unwanted weight loss.   His last U/S or CT Scan was approximately 11/19/2015.   03/15/16: Scott. Harvey returns today with a 2 day history of fever of 99.8 with chills. He has some burning and frequency. He has TaG3 bladder cancer and started BCG last week but had to have a dilation of the fossa to facilitate catheter placment for the BCG. He has rheumatoid arthritis and some right hand pain but no other myalgias. He has had no hematuria. His UA is unremarkable.    01/05/2016: s/p TURBt which revealed TaG3 with muscle in the specimen   02/09/2016: repeat TURBT showed no tumor. He has a right ureteral stent in place  02/16/2016: Pt notes worsening LUTS with the stent in place. He denies hematuria but does have dysuria   05/15/2016: He has finished 6 weeks of BCG. No dysuria. no hematuria   08/28/2016: no hematuria or dysuria   12/04/2016:No new LUTS. no hematuria or dysuria   03/12/2017: NO new LUTS. no hematuria. He has nocturia 1-2x   06/11/2017: No hematuria or dysuria. He has stable LUTS   09/17/2017: No new LUTS. no hematuria or  dysuria   12/24/2017: No hematuria or dysuria. no worsening LUTS.   09/16/2018: No hematuria or dysuria. Patient urinating well on flomax  Blood pressure 116/85, pulse 86, temperature (!) 97 F (36.1 C), height 5\' 9"  (1.753 m), weight 252 lb (114.3 kg). NED. A&Ox3.   No respiratory distress   Abd soft, NT, ND Normal phallus with bilateral descended testicles  Cystoscopy Procedure Note  Patient identification was confirmed, informed consent was obtained, and patient was prepped using Betadine solution.  Lidocaine jelly was administered per urethral meatus.     Pre-Procedure: - Inspection reveals a normal caliber ureteral meatus.  Procedure: The flexible cystoscope was introduced without difficulty - No urethral strictures/lesions are present. - Enlarged prostate  - Normal bladder neck - right ureteral orifice identified. Left orifice surgically absent - Bladder mucosa  reveals no ulcers, tumors, or lesions - No bladder stones - No trabeculation  Retroflexion shows 1cm intravesical prostatic protrusion   Post-Procedure: - Patient tolerated the procedure well  Assessment/ Plan: 1. Bladder cancer hx -RTC 6 months for cystoscopy  2. BPH with weak stream -flomax 0.4mg  daily Nicolette Bang, MD

## 2019-09-03 DIAGNOSIS — N184 Chronic kidney disease, stage 4 (severe): Secondary | ICD-10-CM | POA: Diagnosis not present

## 2019-09-03 DIAGNOSIS — M069 Rheumatoid arthritis, unspecified: Secondary | ICD-10-CM | POA: Diagnosis not present

## 2019-09-03 DIAGNOSIS — Z79899 Other long term (current) drug therapy: Secondary | ICD-10-CM | POA: Diagnosis not present

## 2019-09-03 DIAGNOSIS — R7301 Impaired fasting glucose: Secondary | ICD-10-CM | POA: Diagnosis not present

## 2019-09-03 DIAGNOSIS — B0222 Postherpetic trigeminal neuralgia: Secondary | ICD-10-CM | POA: Diagnosis not present

## 2019-09-03 DIAGNOSIS — M1000 Idiopathic gout, unspecified site: Secondary | ICD-10-CM | POA: Diagnosis not present

## 2019-09-07 DIAGNOSIS — E875 Hyperkalemia: Secondary | ICD-10-CM | POA: Diagnosis not present

## 2019-09-07 DIAGNOSIS — Z79899 Other long term (current) drug therapy: Secondary | ICD-10-CM | POA: Diagnosis not present

## 2019-09-07 DIAGNOSIS — N184 Chronic kidney disease, stage 4 (severe): Secondary | ICD-10-CM | POA: Diagnosis not present

## 2019-09-10 DIAGNOSIS — E875 Hyperkalemia: Secondary | ICD-10-CM | POA: Diagnosis not present

## 2019-09-10 DIAGNOSIS — B0229 Other postherpetic nervous system involvement: Secondary | ICD-10-CM | POA: Diagnosis not present

## 2019-09-10 DIAGNOSIS — N184 Chronic kidney disease, stage 4 (severe): Secondary | ICD-10-CM | POA: Diagnosis not present

## 2019-09-13 ENCOUNTER — Telehealth: Payer: Self-pay | Admitting: Physician Assistant

## 2019-09-13 NOTE — Telephone Encounter (Signed)
I connected by phone with Scott Harvey and/or patient's caregiver on 09/13/2019 at 1:11 PM to discuss the potential vaccination through our Homebound vaccination initiative.   Prevaccination Checklist for COVID-19 Vaccines  1.  Are you feeling sick today? no  2.  Have you ever received a dose of a COVID-19 vaccine?  no      If yes, which one? None   3.  Have you ever had an allergic reaction: (This would include a severe reaction [ e.g., anaphylaxis] that required treatment with epinephrine or EpiPen or that caused you to go to the hospital.  It would also include an allergic reaction that occurred within 4 hours that caused hives, swelling, or respiratory distress, including wheezing.) A.  A previous dose of COVID-19 vaccine. no  B.  A vaccine or injectable therapy that contains multiple components, one of which is a COVID-19 vaccine component, but it is not known which component elicited the immediate reaction. no  C.  Are you allergic to polyethylene glycol? no  D. Are you allergic to Polysorbate, which is found in some vaccines, film coated tablets and intravenous steroids?  no    4.  Have you ever had an allergic reaction to another vaccine (other than COVID-19 vaccine) or an injectable medication? (This would include a severe reaction [ e.g., anaphylaxis] that required treatment with epinephrine or EpiPen or that caused you to go to the hospital.  It would also include an allergic reaction that occurred within 4 hours that caused hives, swelling, or respiratory distress, including wheezing.)  no   5.  Have you ever had a severe allergic reaction (e.g., anaphylaxis) to something other than a component of the COVID-19 vaccine, or any vaccine or injectable medication?  This would include food, pet, venom, environmental, or oral medication allergies.  no   6.  Have you received any vaccine in the last 14 days? no   7.  Have you ever had a positive test for COVID-19 or has a doctor ever  told you that you had COVID-19?  no   8.  Have you received passive antibody therapy (monoclonal antibodies or convalescent serum) as a treatment for COVID-19? no   9.  Do you have a weakened immune system caused by something such as HIV infection or cancer or do you take immunosuppressive drugs or therapies?  no   10.  Do you have a bleeding disorder or are you taking a blood thinner? no   11.  Are you pregnant or breast-feeding? no   12.  Do you have dermal fillers? no   __________________   This patient is a 86 y.o. male that meets the FDA criteria to receive homebound vaccination. Patient or parent/caregiver understands they have the option to accept or refuse homebound vaccination.  Patient passed the pre-screening checklist and would like to proceed with homebound vaccination.  Based on questionnaire above, I recommend the patient be observed for 15 minutes.  There are no other household members/caregivers who are also interested in receiving the vaccine.   Pt very hesitant but encouraged by Dr. Willey Blade to get vaccine. Worried about previous shingles infection with what sounds like post herpetic neuralgia. I spoke with him for over 15 minutes and he finally decided to get the vaccine.    I will send the patient's information to our scheduling team who will reach out to schedule the patient and potential caregiver/family members for homebound vaccination.     Scott Harvey 09/13/2019 1:11 PM

## 2019-09-21 ENCOUNTER — Ambulatory Visit: Payer: Medicare HMO

## 2019-09-22 ENCOUNTER — Ambulatory Visit: Payer: Medicare HMO | Attending: Critical Care Medicine

## 2019-09-22 DIAGNOSIS — Z23 Encounter for immunization: Secondary | ICD-10-CM

## 2019-09-22 NOTE — Progress Notes (Signed)
   Covid-19 Vaccination Clinic  Name:  Scott Harvey    MRN: [id number] DOB: [birth year]  09/22/2019  Scott Harvey was observed post Covid-19 immunization for 15 minutes without incident. He was provided with Vaccine Information Sheet and instruction to access the V-Safe system.   Scott Harvey was instructed to call 911 with any severe reactions post vaccine: Marland Kitchen Difficulty breathing  . Swelling of face and throat  . A fast heartbeat  . A bad rash all over body  . Dizziness and weakness   Immunizations Administered    Name Date Dose VIS Date Route   Moderna COVID-19 Vaccine 09/22/2019 11:25 AM 0.5 mL 01/2019 Intramuscular   Manufacturer: Moderna   Lot: [id number]   [place]: [id number]

## 2019-10-13 ENCOUNTER — Other Ambulatory Visit: Payer: Self-pay

## 2019-10-13 DIAGNOSIS — N138 Other obstructive and reflux uropathy: Secondary | ICD-10-CM

## 2019-10-13 MED ORDER — FINASTERIDE 5 MG PO TABS: 5.0000 mg | ORAL_TABLET | Freq: Every day | ORAL | 11 refills | Status: AC

## 2019-10-19 ENCOUNTER — Ambulatory Visit: Payer: Medicare HMO

## 2019-10-20 ENCOUNTER — Ambulatory Visit: Payer: Medicare HMO | Attending: Critical Care Medicine

## 2019-10-20 DIAGNOSIS — Z23 Encounter for immunization: Secondary | ICD-10-CM

## 2019-12-03 DIAGNOSIS — Z79899 Other long term (current) drug therapy: Secondary | ICD-10-CM | POA: Diagnosis not present

## 2019-12-03 DIAGNOSIS — N184 Chronic kidney disease, stage 4 (severe): Secondary | ICD-10-CM | POA: Diagnosis not present

## 2019-12-03 DIAGNOSIS — M109 Gout, unspecified: Secondary | ICD-10-CM | POA: Diagnosis not present

## 2019-12-03 DIAGNOSIS — C641 Malignant neoplasm of right kidney, except renal pelvis: Secondary | ICD-10-CM | POA: Diagnosis not present

## 2019-12-10 DIAGNOSIS — N183 Chronic kidney disease, stage 3 unspecified: Secondary | ICD-10-CM | POA: Diagnosis not present

## 2019-12-10 DIAGNOSIS — B0229 Other postherpetic nervous system involvement: Secondary | ICD-10-CM | POA: Diagnosis not present

## 2019-12-10 DIAGNOSIS — N2581 Secondary hyperparathyroidism of renal origin: Secondary | ICD-10-CM | POA: Diagnosis not present

## 2019-12-10 DIAGNOSIS — Z23 Encounter for immunization: Secondary | ICD-10-CM | POA: Diagnosis not present

## 2019-12-10 DIAGNOSIS — E875 Hyperkalemia: Secondary | ICD-10-CM | POA: Diagnosis not present

## 2019-12-10 DIAGNOSIS — N184 Chronic kidney disease, stage 4 (severe): Secondary | ICD-10-CM | POA: Diagnosis not present

## 2020-01-18 ENCOUNTER — Other Ambulatory Visit: Payer: Self-pay | Admitting: Urology

## 2020-01-18 DIAGNOSIS — N138 Other obstructive and reflux uropathy: Secondary | ICD-10-CM

## 2020-02-07 ENCOUNTER — Other Ambulatory Visit: Payer: Self-pay | Admitting: Urology

## 2020-02-10 ENCOUNTER — Other Ambulatory Visit: Payer: Self-pay

## 2020-02-10 ENCOUNTER — Telehealth: Payer: Self-pay

## 2020-02-10 NOTE — Telephone Encounter (Addendum)
Pt called today confused on his medication.  Pt reports he was told by the pharmacy his tamsulosin was denied by our office. Pt requested an old prescription to be filled of alfuzosin-  I read last OV from July- pt should be on Tamsulosin.  Refill was submitted for 90 tablets and 3 refills on 01/18/2020. Per [facility] pt picked up tamsulosin on 12/24/2019 for 90 day supply.  Pt reports the alfuzosin causes dizziness.  Pt will call pharmacy to have tamsulosin filled for correct medication.   Per [facility] they are unable to resupply medication due to be lost per [facility]. Pt aware may have to full price for medication.

## 2020-03-01 ENCOUNTER — Other Ambulatory Visit: Payer: Medicare HMO | Admitting: Urology

## 2020-03-10 DIAGNOSIS — E875 Hyperkalemia: Secondary | ICD-10-CM | POA: Diagnosis not present

## 2020-03-10 DIAGNOSIS — M109 Gout, unspecified: Secondary | ICD-10-CM | POA: Diagnosis not present

## 2020-03-10 DIAGNOSIS — Z79899 Other long term (current) drug therapy: Secondary | ICD-10-CM | POA: Diagnosis not present

## 2020-03-10 DIAGNOSIS — E211 Secondary hyperparathyroidism, not elsewhere classified: Secondary | ICD-10-CM | POA: Diagnosis not present

## 2020-03-10 DIAGNOSIS — N183 Chronic kidney disease, stage 3 unspecified: Secondary | ICD-10-CM | POA: Diagnosis not present

## 2020-03-13 ENCOUNTER — Inpatient Hospital Stay (HOSPITAL_COMMUNITY)
Admission: EM | Admit: 2020-03-13 | Discharge: 2020-04-18 | DRG: 177 | Disposition: E | Payer: Medicare HMO | Attending: Internal Medicine | Admitting: Internal Medicine

## 2020-03-13 ENCOUNTER — Encounter (HOSPITAL_COMMUNITY): Payer: Self-pay

## 2020-03-13 ENCOUNTER — Emergency Department (HOSPITAL_COMMUNITY): Payer: Medicare HMO

## 2020-03-13 ENCOUNTER — Other Ambulatory Visit: Payer: Self-pay

## 2020-03-13 DIAGNOSIS — B0229 Other postherpetic nervous system involvement: Secondary | ICD-10-CM | POA: Diagnosis present

## 2020-03-13 DIAGNOSIS — U071 COVID-19: Principal | ICD-10-CM | POA: Diagnosis present

## 2020-03-13 DIAGNOSIS — E870 Hyperosmolality and hypernatremia: Secondary | ICD-10-CM | POA: Diagnosis not present

## 2020-03-13 DIAGNOSIS — Z9049 Acquired absence of other specified parts of digestive tract: Secondary | ICD-10-CM | POA: Diagnosis not present

## 2020-03-13 DIAGNOSIS — R059 Cough, unspecified: Secondary | ICD-10-CM | POA: Diagnosis not present

## 2020-03-13 DIAGNOSIS — I10 Essential (primary) hypertension: Secondary | ICD-10-CM | POA: Diagnosis not present

## 2020-03-13 DIAGNOSIS — N400 Enlarged prostate without lower urinary tract symptoms: Secondary | ICD-10-CM | POA: Diagnosis present

## 2020-03-13 DIAGNOSIS — E8809 Other disorders of plasma-protein metabolism, not elsewhere classified: Secondary | ICD-10-CM | POA: Diagnosis present

## 2020-03-13 DIAGNOSIS — Z66 Do not resuscitate: Secondary | ICD-10-CM | POA: Diagnosis not present

## 2020-03-13 DIAGNOSIS — I517 Cardiomegaly: Secondary | ICD-10-CM | POA: Diagnosis not present

## 2020-03-13 DIAGNOSIS — Z905 Acquired absence of kidney: Secondary | ICD-10-CM | POA: Diagnosis not present

## 2020-03-13 DIAGNOSIS — R410 Disorientation, unspecified: Secondary | ICD-10-CM | POA: Diagnosis not present

## 2020-03-13 DIAGNOSIS — Z8551 Personal history of malignant neoplasm of bladder: Secondary | ICD-10-CM | POA: Diagnosis not present

## 2020-03-13 DIAGNOSIS — R404 Transient alteration of awareness: Secondary | ICD-10-CM | POA: Diagnosis not present

## 2020-03-13 DIAGNOSIS — N1832 Chronic kidney disease, stage 3b: Secondary | ICD-10-CM | POA: Diagnosis present

## 2020-03-13 DIAGNOSIS — R0689 Other abnormalities of breathing: Secondary | ICD-10-CM

## 2020-03-13 DIAGNOSIS — Z87891 Personal history of nicotine dependence: Secondary | ICD-10-CM

## 2020-03-13 DIAGNOSIS — Z6837 Body mass index (BMI) 37.0-37.9, adult: Secondary | ICD-10-CM | POA: Diagnosis not present

## 2020-03-13 DIAGNOSIS — J690 Pneumonitis due to inhalation of food and vomit: Secondary | ICD-10-CM | POA: Diagnosis present

## 2020-03-13 DIAGNOSIS — E669 Obesity, unspecified: Secondary | ICD-10-CM | POA: Diagnosis present

## 2020-03-13 DIAGNOSIS — F09 Unspecified mental disorder due to known physiological condition: Secondary | ICD-10-CM | POA: Diagnosis not present

## 2020-03-13 DIAGNOSIS — N183 Chronic kidney disease, stage 3 unspecified: Secondary | ICD-10-CM | POA: Diagnosis present

## 2020-03-13 DIAGNOSIS — T380X5A Adverse effect of glucocorticoids and synthetic analogues, initial encounter: Secondary | ICD-10-CM | POA: Diagnosis not present

## 2020-03-13 DIAGNOSIS — I2782 Chronic pulmonary embolism: Secondary | ICD-10-CM | POA: Diagnosis not present

## 2020-03-13 DIAGNOSIS — I2609 Other pulmonary embolism with acute cor pulmonale: Secondary | ICD-10-CM | POA: Diagnosis not present

## 2020-03-13 DIAGNOSIS — G9341 Metabolic encephalopathy: Secondary | ICD-10-CM | POA: Diagnosis present

## 2020-03-13 DIAGNOSIS — R0600 Dyspnea, unspecified: Secondary | ICD-10-CM | POA: Diagnosis not present

## 2020-03-13 DIAGNOSIS — J1282 Pneumonia due to coronavirus disease 2019: Secondary | ICD-10-CM | POA: Diagnosis present

## 2020-03-13 DIAGNOSIS — R0602 Shortness of breath: Secondary | ICD-10-CM | POA: Diagnosis not present

## 2020-03-13 DIAGNOSIS — R21 Rash and other nonspecific skin eruption: Secondary | ICD-10-CM | POA: Diagnosis not present

## 2020-03-13 DIAGNOSIS — Z85528 Personal history of other malignant neoplasm of kidney: Secondary | ICD-10-CM

## 2020-03-13 DIAGNOSIS — J189 Pneumonia, unspecified organism: Secondary | ICD-10-CM | POA: Diagnosis not present

## 2020-03-13 DIAGNOSIS — N179 Acute kidney failure, unspecified: Secondary | ICD-10-CM | POA: Diagnosis present

## 2020-03-13 DIAGNOSIS — J90 Pleural effusion, not elsewhere classified: Secondary | ICD-10-CM | POA: Diagnosis not present

## 2020-03-13 DIAGNOSIS — R9431 Abnormal electrocardiogram [ECG] [EKG]: Secondary | ICD-10-CM | POA: Diagnosis not present

## 2020-03-13 DIAGNOSIS — I959 Hypotension, unspecified: Secondary | ICD-10-CM | POA: Diagnosis not present

## 2020-03-13 DIAGNOSIS — I2699 Other pulmonary embolism without acute cor pulmonale: Secondary | ICD-10-CM

## 2020-03-13 DIAGNOSIS — R809 Proteinuria, unspecified: Secondary | ICD-10-CM | POA: Diagnosis present

## 2020-03-13 DIAGNOSIS — R4182 Altered mental status, unspecified: Secondary | ICD-10-CM | POA: Diagnosis not present

## 2020-03-13 DIAGNOSIS — R918 Other nonspecific abnormal finding of lung field: Secondary | ICD-10-CM | POA: Diagnosis not present

## 2020-03-13 DIAGNOSIS — J9601 Acute respiratory failure with hypoxia: Secondary | ICD-10-CM | POA: Diagnosis present

## 2020-03-13 DIAGNOSIS — M069 Rheumatoid arthritis, unspecified: Secondary | ICD-10-CM | POA: Diagnosis present

## 2020-03-13 DIAGNOSIS — J9690 Respiratory failure, unspecified, unspecified whether with hypoxia or hypercapnia: Secondary | ICD-10-CM | POA: Diagnosis not present

## 2020-03-13 DIAGNOSIS — J9811 Atelectasis: Secondary | ICD-10-CM | POA: Diagnosis not present

## 2020-03-13 DIAGNOSIS — Z7189 Other specified counseling: Secondary | ICD-10-CM | POA: Diagnosis not present

## 2020-03-13 DIAGNOSIS — Z515 Encounter for palliative care: Secondary | ICD-10-CM

## 2020-03-13 DIAGNOSIS — E66812 Obesity, class 2: Secondary | ICD-10-CM | POA: Diagnosis present

## 2020-03-13 DIAGNOSIS — Z8701 Personal history of pneumonia (recurrent): Secondary | ICD-10-CM | POA: Diagnosis not present

## 2020-03-13 DIAGNOSIS — R531 Weakness: Secondary | ICD-10-CM | POA: Diagnosis not present

## 2020-03-13 LAB — URINALYSIS, ROUTINE W REFLEX MICROSCOPIC
Bilirubin Urine: NEGATIVE
Glucose, UA: NEGATIVE mg/dL
Ketones, ur: NEGATIVE mg/dL
Leukocytes,Ua: NEGATIVE
Nitrite: NEGATIVE
Protein, ur: >=300 mg/dL — AB
Specific Gravity, Urine: 1.018 (ref 1.005–1.030)
pH: 5 (ref 5.0–8.0)

## 2020-03-13 LAB — CBC WITH DIFFERENTIAL/PLATELET
Abs Immature Granulocytes: 0.05 10*3/uL (ref 0.00–0.07)
Basophils Absolute: 0 10*3/uL (ref 0.0–0.1)
Basophils Relative: 0 %
Eosinophils Absolute: 0 10*3/uL (ref 0.0–0.5)
Eosinophils Relative: 0 %
HCT: 44.5 % (ref 39.0–52.0)
Hemoglobin: 13.5 g/dL (ref 13.0–17.0)
Immature Granulocytes: 1 %
Lymphocytes Relative: 5 %
Lymphs Abs: 0.4 10*3/uL — ABNORMAL LOW (ref 0.7–4.0)
MCH: 28.2 pg (ref 26.0–34.0)
MCHC: 30.3 g/dL (ref 30.0–36.0)
MCV: 93.1 fL (ref 80.0–100.0)
Monocytes Absolute: 0.4 10*3/uL (ref 0.1–1.0)
Monocytes Relative: 5 %
Neutro Abs: 6.6 10*3/uL (ref 1.7–7.7)
Neutrophils Relative %: 89 %
Platelets: 170 10*3/uL (ref 150–400)
RBC: 4.78 MIL/uL (ref 4.22–5.81)
RDW: 14.7 % (ref 11.5–15.5)
WBC: 7.5 10*3/uL (ref 4.0–10.5)
nRBC: 0 % (ref 0.0–0.2)

## 2020-03-13 LAB — LACTATE DEHYDROGENASE: LDH: 338 U/L — ABNORMAL HIGH (ref 98–192)

## 2020-03-13 LAB — COMPREHENSIVE METABOLIC PANEL
ALT: 59 U/L — ABNORMAL HIGH (ref 0–44)
AST: 94 U/L — ABNORMAL HIGH (ref 15–41)
Albumin: 3 g/dL — ABNORMAL LOW (ref 3.5–5.0)
Alkaline Phosphatase: 82 U/L (ref 38–126)
Anion gap: 12 (ref 5–15)
BUN: 43 mg/dL — ABNORMAL HIGH (ref 8–23)
CO2: 21 mmol/L — ABNORMAL LOW (ref 22–32)
Calcium: 8.5 mg/dL — ABNORMAL LOW (ref 8.9–10.3)
Chloride: 109 mmol/L (ref 98–111)
Creatinine, Ser: 2.19 mg/dL — ABNORMAL HIGH (ref 0.61–1.24)
GFR, Estimated: 28 mL/min — ABNORMAL LOW (ref >60)
Glucose, Bld: 92 mg/dL (ref 70–99)
Potassium: 4.2 mmol/L (ref 3.5–5.1)
Sodium: 142 mmol/L (ref 135–145)
Total Bilirubin: 1.1 mg/dL (ref 0.3–1.2)
Total Protein: 6.8 g/dL (ref 6.5–8.1)

## 2020-03-13 LAB — PROCALCITONIN: Procalcitonin: 0.29 ng/mL

## 2020-03-13 LAB — LACTIC ACID, PLASMA: Lactic Acid, Venous: 1.2 mmol/L (ref 0.5–1.9)

## 2020-03-13 LAB — SARS CORONAVIRUS 2 BY RT PCR (HOSPITAL ORDER, PERFORMED IN ~~LOC~~ HOSPITAL LAB): SARS Coronavirus 2: POSITIVE — AB

## 2020-03-13 LAB — FERRITIN: Ferritin: 961 ng/mL — ABNORMAL HIGH (ref 24–336)

## 2020-03-13 LAB — TROPONIN I (HIGH SENSITIVITY)
Troponin I (High Sensitivity): 54 ng/L — ABNORMAL HIGH (ref <18)
Troponin I (High Sensitivity): 53 ng/L — ABNORMAL HIGH (ref <18)

## 2020-03-13 LAB — D-DIMER, QUANTITATIVE: D-Dimer, Quant: 2.98 ug/mL-FEU — ABNORMAL HIGH (ref 0.00–0.50)

## 2020-03-13 LAB — FIBRINOGEN: Fibrinogen: 658 mg/dL — ABNORMAL HIGH (ref 210–475)

## 2020-03-13 LAB — TRIGLYCERIDES: Triglycerides: 103 mg/dL (ref <150)

## 2020-03-13 LAB — ETHANOL: Alcohol, Ethyl (B): <10 mg/dL (ref <10)

## 2020-03-13 LAB — C-REACTIVE PROTEIN: CRP: 15.8 mg/dL — ABNORMAL HIGH (ref <1.0)

## 2020-03-13 MED ORDER — SODIUM CHLORIDE 0.9 % IV SOLN
100.0000 mg | Freq: Every day | INTRAVENOUS | Status: AC
  Administered 2020-03-14 – 2020-03-17 (×4): 100 mg via INTRAVENOUS
  Filled 2020-03-13 (×4): qty 20

## 2020-03-13 MED ORDER — DEXAMETHASONE SODIUM PHOSPHATE 10 MG/ML IJ SOLN
10.0000 mg | Freq: Once | INTRAMUSCULAR | Status: AC
  Administered 2020-03-13: 10 mg via INTRAVENOUS
  Filled 2020-03-13: qty 1

## 2020-03-13 MED ORDER — SODIUM CHLORIDE 0.9 % IV SOLN
100.0000 mg | INTRAVENOUS | Status: AC
  Administered 2020-03-13 – 2020-03-14 (×2): 100 mg via INTRAVENOUS

## 2020-03-13 NOTE — H&P (Signed)
History and Physical    Scott Harvey:[id number] DOB: 1928/08/04 DOA: 03/19/2020  PCP: Asencion Noble, MD   Patient coming from: Home.   I have personally briefly reviewed patient's old medical records in [place]  Chief Complaint: AMS and hypoxia.  HPI: Scott Harvey is a 87 y.o. male with medical history significant of osteoarthritis, bladder cancer, stage III CKD, dry skin, history of GI bleed due to aspirin use, history of renal cell carcinoma with left nephrectomy, impaired hearing, macular degeneration of both eyes, right renal cyst, history of herpes zoster who is coming to the emergency department via EMS after family last saw him on Saturday, but were concerned today since the patient had not been answering the phone and was subsequently found altered on the bathroom floor.  EMS described his oxygen saturation was 84% on room air and placed in an osteochondroma oxygen.  Per triage notes, he is usually oriented x4, but when seen, the patient was oriented to name, place, partially oriented to time/date and situation.  He denied headache, sore throat, chest pain, abdominal or back pain.  ED Course: Initial vital signs were temperature 98.2 F, pulse 102, respirations 27, BP 120/86 mmHg O2 sat 91% on 4 LPM via [place].  The patient received dexamethasone 10 mg IVP and 100 mg of remdesivir in the emergency department.  Labwork: Urinalysis was hazy, with moderate hemoglobinuria and proteinuria more than 300 mg/dL on chemical evaluation.  There were 21-50 RBC, 6-10 WBC, rare bacteria and mucus on urine microscopic examination.  CBC showed a white count of 7.5 with 89% neutrophils, 5% lymphocytes and 5% monocytes.  Hemoglobin was 13.5 g/dL and platelets 170.  D-dimer was 2.98 mcg/mL and fibrinogen was 658 mg/dL.  Triglycerides 103 mg/dL.  CRP 15.9 mg/dL.  Ferritin 961 ng/mL.  LDH was 338 units/L.  Troponin was 54 and then 53 ng/L.  Lactic acid was normal twice.  Alcohol was undetectable.   CMP showed CO2 21 mmol/L with an anion gap of 12.  All other electrolytes are within normal limits when calcium is corrected to albumin.  BUN was 43, creatinine 2.19 and calcium 8.5 mg/dL.  Total protein 6.9 and albumin 3.0 g/dL.  AST 94 and ALT 59 units/L.  Alkaline phosphatase and total bilirubin were normal.  Imaging: A portable chest radiograph showed mid to lower lung field interstitial and hazy airspace density that may represent edema, pneumonia or combination.  CT head without contrast show atrophy and chronic microvascular disease, but there was no acute intracranial normality.  Please see images and full radiology report for further detail.  Review of Systems: As per HPI otherwise all other systems reviewed and are negative.  Past Medical History:  Diagnosis Date  . Arthritis   . Bladder cancer ([place])   . CKD (chronic kidney disease), stage III ([place])   . Dry skin    generalized itching ?d/t kidney disease  . H/O: GI bleed    d/t ASA  . History of renal cell carcinoma    1980's left nephrectomy  . HOH (hard of hearing)    right ear better than left  . Macular degeneration of both eyes   . Renal cyst, right   . Shingles 03/11/2019  . Solitary right kidney    acquired-- s/p left nephrectomy 1980's    Past Surgical History:  Procedure Laterality Date  . APPENDECTOMY  1954  . CATARACT EXTRACTION W/ INTRAOCULAR LENS  IMPLANT, BILATERAL  2012  . CHOLECYSTECTOMY  [place] N/A 02/07/2016   Procedure: [place] AND BLADDER BIOPSY;  Surgeon: Cleon Gustin, MD;  Location: [facility];  Service: Urology;  Laterality: N/A;  . CYSTOSCOPY WITH RETROGRADE PYELOGRAM, URETEROSCOPY AND STENT PLACEMENT Right 01/01/2016   Procedure: CYSTOSCOPY WITH RETROGRADE PYELOGRAM/URETERAL STENT PLACEMENT;  Surgeon: Cleon Gustin, MD;  Location: [facility];  Service: Urology;  Laterality: Right;  . CYSTOSCOPY WITH URETHRAL DILATATION N/A  01/01/2016   Procedure: CYSTOSCOPY WITH URETHRAL DILATATION;  Surgeon: Cleon Gustin, MD;  Location: [facility];  Service: Urology;  Laterality: N/A;  . TOTAL NEPHRECTOMY Left 1980's   renal cell carcinoma  . TRANSURETHRAL RESECTION OF BLADDER TUMOR N/A 01/01/2016   Procedure: TRANSURETHRAL RESECTION OF BLADDER TUMOR (TURBT);  Surgeon: Cleon Gustin, MD;  Location: [facility];  Service: Urology;  Laterality: N/A;    Social History  reports that he quit smoking about 14 years ago. His smoking use included pipe. He has never used smokeless tobacco. He reports that he does not drink alcohol and does not use drugs.  No Known Allergies  Family medical history Several family members with hypertension.  Prior to Admission medications   Medication Sig Start Date End Date Taking? Authorizing Provider  acetaminophen (TYLENOL) 325 MG tablet Take 650 mg by mouth every 6 (six) hours as needed for mild pain.     [provider]  alfuzosin (UROXATRAL) 10 MG 24 hr tablet TAKE 1 TABLET BY MOUTH AT BEDTIME 02/07/20   McKenzie, Candee Furbish, MD  finasteride (PROSCAR) 5 MG tablet Take 1 tablet (5 mg total) by mouth daily. 10/13/19   McKenzie, Candee Furbish, MD  gabapentin (NEURONTIN) 100 MG capsule 1-2 tabs at night, may increase to 3 if tolerated. 03/10/19   Wendall Mola, NP  hydrocortisone 2.5 % lotion  06/10/19   [provider]  HYDROCORTISONE, TOPICAL, 2 % LOTN Apply to affected area bid Patient not taking: Reported on 08/30/2019 03/10/19   Wendall Mola, NP  hydroxychloroquine (PLAQUENIL) 200 MG tablet  07/08/19   [provider]  loratadine (CLARITIN) 10 MG tablet Take 10 mg by mouth daily.    [provider]  silodosin (RAPAFLO) 8 MG CAPS capsule TAKE 1 CAPSULE(8 MG) BY MOUTH DAILY WITH BREAKFAST FOR 1 DOSE 08/04/19   McKenzie, Candee Furbish, MD  sodium bicarbonate 650 MG tablet  06/10/19   [provider]  tamsulosin  (FLOMAX) 0.4 MG CAPS capsule TAKE 1 CAPSULE(0.4 MG) BY MOUTH DAILY 01/18/20   McKenzie, Candee Furbish, MD  triamcinolone (KENALOG) 0.147 MG/GM topical spray  04/26/19   [provider]   Physical Exam: Vitals:   02/25/2020 2155 03/07/2020 2230 03/18/2020 2300 02/23/2020 2330  BP: (!) 139/94 (!) 128/101 (!) 122/94 127/89  Pulse: (!) 103 100 98 99  Resp: (!) 28 (!) 23 (!) 36 19  Temp:      TempSrc:      SpO2: 99% 99% 98% 91%  Weight:      Height:       Constitutional: Looks acutely ill.  NAD, calm, comfortable Eyes: PERRL, lids and conjunctivae normal ENMT: Mucous membranes are dry. Posterior pharynx clear of any exudate or lesions. Neck: normal, supple, no masses, no thyromegaly Respiratory: Tachypneic with an RR in the mid 20s.  Bilateral scattered crackles, particularly on both bases.  No wheezing, no rhonchi.  No accessory muscle use. Cardiovascular: Regular rate and rhythm, no murmurs / rubs /  gallops. No extremity edema. 2+ pedal pulses. No carotid bruits.  Abdomen: Obese, no distention.  Bowel sounds positive.  Soft, no tenderness, no masses palpated. No hepatosplenomegaly. Musculoskeletal: Moderate generalized weakness.  No clubbing / cyanosis.  Good ROM, no contractures. Normal muscle tone.  Skin: no rashes, lesions, ulcers on very limited dermatological examination. Neurologic: CN 2-12 grossly intact. Sensation intact, DTR normal. Strength seems to be equal in all 4.  Psychiatric: Alert, oriented x2, partially oriented to time and situation.  Labs on Admission: I have personally reviewed following labs and imaging studies  CBC: Recent Labs  Lab 02/27/2020 2114  WBC 7.5  NEUTROABS 6.6  HGB 13.5  HCT 44.5  MCV 93.1  PLT 283    Basic Metabolic Panel: Recent Labs  Lab 03/18/2020 2114  NA 142  K 4.2  CL 109  CO2 21*  GLUCOSE 92  BUN 43*  CREATININE 2.19*  CALCIUM 8.5*    GFR: Estimated Creatinine Clearance: 28.8 mL/min (A) (by C-G formula based on SCr of 2.19 mg/dL  (H)).  Liver Function Tests: Recent Labs  Lab 02/23/2020 2114  AST 94*  ALT 59*  ALKPHOS 82  BILITOT 1.1  PROT 6.8  ALBUMIN 3.0*    Urine analysis:    Component Value Date/Time   COLORURINE YELLOW 02/23/2020 2114   APPEARANCEUR HAZY (A) 03/05/2020 2114   LABSPEC 1.018 03/18/2020 2114   PHURINE 5.0 03/09/2020 2114   GLUCOSEU NEGATIVE 03/11/2020 2114   HGBUR MODERATE (A) 03/07/2020 2114   BILIRUBINUR NEGATIVE 02/21/2020 2114        KETONESUR NEGATIVE 03/16/2020 2114   PROTEINUR >=300 (A) 03/01/2020 2114        NITRITE NEGATIVE 03/11/2020 2114   LEUKOCYTESUR NEGATIVE 02/23/2020 2114    Radiological Exams on Admission: CT Head Wo Contrast  Result Date: 02/28/2020 CLINICAL DATA:  Mental status changes EXAM: CT HEAD WITHOUT CONTRAST TECHNIQUE: Contiguous axial images were obtained from the base of the skull through the vertex without intravenous contrast. COMPARISON:  None. FINDINGS: Brain: There is atrophy and chronic small vessel disease changes. No acute intracranial abnormality. Specifically, no hemorrhage, hydrocephalus, mass lesion, acute infarction, or significant intracranial injury. Vascular: No hyperdense vessel or unexpected calcification. Skull: No acute calvarial abnormality. Sinuses/Orbits: No acute findings Other: None IMPRESSION: Atrophy, chronic microvascular disease. No acute intracranial abnormality. Electronically Signed   By: Rolm Baptise M.D.   On: 03/14/2020 22:23   DG Chest Portable 1 View  Result Date: 03/12/2020 CLINICAL DATA:  87 year old male with shortness of breath. EXAM: PORTABLE CHEST 1 VIEW COMPARISON:  Chest radiograph dated 11/02/2010. FINDINGS: There is mild eventration of the left hemidiaphragm. Mid to lower lung field interstitial and hazy airspace density may represent edema or pneumonia or combination. Clinical correlation is recommended. No large pleural scars and a small left pleural effusion may be present. No pneumothorax. Stable cardiac  silhouette. No acute osseous pathology. IMPRESSION: Mid to lower lung field interstitial and hazy airspace density may represent edema or pneumonia or combination. Electronically Signed   By: Anner Crete M.D.   On: 03/10/2020 22:16    EKG: Independently reviewed.  Vent. rate 96 BPM PR interval * ms QRS duration 89 ms QT/QTc 353/447 ms P-R-T axes 38 -44 31 Sinus rhythm Left axis deviation Low voltage, precordial leads Minimal ST depression, lateral leads  Assessment/Plan Principal Problem:   Pneumonia due to COVID-19 virus Observation/telemetry. Continue supplemental oxygen. Flutter valve and incentive spirometry. Prone positioning as tolerated. Bronchodilators every 6 hours. Acetaminophen as  needed. Antitussives as needed. Remdesivir per pharmacy.. Dexamethasone 6 mg IVP daily. Follow CBC, CMP and inflammatory markers.  Active Problems:   Acute renal failure superimposed on stage 3b chronic kidney disease (HCC) NaCl 0.45% 125 mL/h for 8 hours. Monitor hydration status closely. Monitor renal function and electrolytes.    Class 2 obesity Lifestyle modifications. Follow-up with PCP.    Proteinuria In the setting of acute viral illness.    Hypoalbuminemia Protein supplementation. Will consider nutritional services evaluation.   DVT prophylaxis: Lovenox SQ. Code Status:   Full code. Family Communication: Disposition Plan:   Patient is from:  Home.  Anticipated DC to:  Home.  Anticipated DC date:  03/15/2020 or 6 03/16/2020.  Anticipated DC barriers: Clinical status.  Consults called:   Admission status:  Observation/telemetry.  Severity of Illness:  High due to advanced age, class II obesity with a BMI of 37.80 kg/m, hypoalbuminemia and poor physical conditioning.  He is being admitted COVID-19 pneumonia treatment with remdesivir and glucocorticoids.  Prognosis is guarded.  Reubin Milan MD [facility]  How to contact the [facility] Attending or  Consulting provider [place] or covering provider during after hours [place], for this patient?   1. Check the care team in [facility] and look for a) attending/consulting [facility] provider listed and b) the [facility] team listed 2. Log into [email or web address] and use [place]'s universal password to access. If you do not have the password, please contact the hospital operator. 3. Locate the [facility] provider you are looking for under [facility] and page to a number that you can be directly reached. 4. If you still have difficulty reaching the provider, please page the [facility] (Director on Call) for the Hospitalists listed on amion for assistance.  03/18/2020, 11:47 PM   This document was prepared using Dragon voice recognition software and may contain some unintended transcription errors.

## 2020-03-13 NOTE — ED Notes (Signed)
Date and time results received: 03/10/2020 1105   Test: COVID Critical Value: Postitve  Name of Provider Notified: Long  Orders Received? Or Actions Taken?: no action at this time

## 2020-03-13 NOTE — ED Triage Notes (Signed)
Pt brought by [facility]. Pt family said they last seen pt Saturday night and pt had not been answering phone so went to check on him. Pt was found on bathroom floor but pt states that he did not fall. Pt family also says that pt is altered and pt is usually A&O x4. Oxygen on ambulance was 84%, pt placed on 2L. Pt denies any SOB

## 2020-03-13 NOTE — H&P (Incomplete)
History and Physical    Scott Harvey:[id number] DOB: [birth year] DOA: 03/18/2020  PCP: Asencion Noble, MD   Patient coming from: Home.   I have personally briefly reviewed patient's old medical records in [place]  Chief Complaint: AMS and hypoxia.  HPI: Scott Harvey is a 87 y.o. male with medical history significant of osteoarthritis, bladder cancer, stage III CKD, dry skin, history of GI bleed due to aspirin use, history of renal cell carcinoma with left nephrectomy, impaired hearing, macular degeneration of both eyes, right renal cyst, history of herpes zoster who is coming to the emergency department via EMS after family last saw him on Saturday, but were concerned today since the patient had not been answering the phone and was subsequently found altered on the bathroom floor.  He is usually oriented x4.  ED Course: Initial vital signs were temperature   Review of Systems: As per HPI otherwise all other systems reviewed and are negative.  Past Medical History:  Diagnosis Date  . Arthritis   . Bladder cancer ([place])   . CKD (chronic kidney disease), stage III ([place])   . Dry skin    generalized itching ?d/t kidney disease  . H/O: GI bleed    d/t ASA  . History of renal cell carcinoma    1980's left nephrectomy  . HOH (hard of hearing)    right ear better than left  . Macular degeneration of both eyes   . Renal cyst, right   . Shingles 03/11/2019  . Solitary right kidney    acquired-- s/p left nephrectomy 1980's    Past Surgical History:  Procedure Laterality Date  . APPENDECTOMY  1954  . CATARACT EXTRACTION W/ INTRAOCULAR LENS  IMPLANT, BILATERAL  2012  . CHOLECYSTECTOMY  1980  . CYSTOSCOPY WITH FULGERATION N/A 02/07/2016   Procedure: CYSTOSCOPY WITH FULGERATION AND BLADDER BIOPSY;  Surgeon: Cleon Gustin, MD;  Location: [facility];  Service: Urology;  Laterality: N/A;  . CYSTOSCOPY WITH RETROGRADE PYELOGRAM, URETEROSCOPY AND STENT PLACEMENT Right  01/01/2016   Procedure: CYSTOSCOPY WITH RETROGRADE PYELOGRAM/URETERAL STENT PLACEMENT;  Surgeon: Cleon Gustin, MD;  Location: [facility];  Service: Urology;  Laterality: Right;  . CYSTOSCOPY WITH URETHRAL DILATATION N/A 01/01/2016   Procedure: CYSTOSCOPY WITH URETHRAL DILATATION;  Surgeon: Cleon Gustin, MD;  [facility];  Service: Urology;  Laterality: N/A;  . TOTAL NEPHRECTOMY Left 1980's   renal cell carcinoma  . TRANSURETHRAL RESECTION OF BLADDER TUMOR N/A 01/01/2016   Procedure: TRANSURETHRAL RESECTION OF BLADDER TUMOR (TURBT);  Surgeon: Cleon Gustin, MD;  Location: [facility];  Service: Urology;  Laterality: N/A;    Social History  reports that he quit smoking about 14 years ago. His smoking use included pipe. He has never used smokeless tobacco. He reports that he does not drink alcohol and does not use drugs.  No Known Allergies  No family history on file. *** Unacceptable: Noncontributory, unremarkable, or negative. Acceptable: (example)Family history negative for heart disease  Prior to Admission medications   Medication Sig Start Date End Date Taking? Authorizing Provider  acetaminophen (TYLENOL) 325 MG tablet Take 650 mg by mouth every 6 (six) hours as needed for mild pain.     [provider]  alfuzosin (UROXATRAL) 10 MG 24 hr tablet TAKE 1 TABLET BY MOUTH AT BEDTIME 02/07/20   McKenzie, Candee Furbish, MD  finasteride (PROSCAR) 5 MG tablet Take 1 tablet (5 mg total) by mouth daily.  10/13/19   McKenzie, Candee Furbish, MD  gabapentin (NEURONTIN) 100 MG capsule 1-2 tabs at night, may increase to 3 if tolerated. 03/10/19   Wendall Mola, NP  hydrocortisone 2.5 % lotion  06/10/19   [provider]  HYDROCORTISONE, TOPICAL, 2 % LOTN Apply to affected area bid Patient not taking: Reported on 08/30/2019 03/10/19   Wendall Mola, NP  hydroxychloroquine (PLAQUENIL) 200 MG tablet  07/08/19    [provider]  loratadine (CLARITIN) 10 MG tablet Take 10 mg by mouth daily.    [provider]  silodosin (RAPAFLO) 8 MG CAPS capsule TAKE 1 CAPSULE(8 MG) BY MOUTH DAILY WITH BREAKFAST FOR 1 DOSE 08/04/19   McKenzie, Candee Furbish, MD  sodium bicarbonate 650 MG tablet  06/10/19   [provider]  tamsulosin (FLOMAX) 0.4 MG CAPS capsule TAKE 1 CAPSULE(0.4 MG) BY MOUTH DAILY 01/18/20   McKenzie, Candee Furbish, MD  triamcinolone (KENALOG) 0.147 MG/GM topical spray  04/26/19   [provider]   Physical Exam: Vitals:   02/25/2020 2155 02/27/2020 2230 03/17/2020 2300 02/25/2020 2330  BP: (!) 139/94 (!) 128/101 (!) 122/94 127/89  Pulse: (!) 103 100 98 99  Resp: (!) 28 (!) 23 (!) 36 19  Temp:      TempSrc:      SpO2: 99% 99% 98% 91%  Weight:      Height:       Constitutional: NAD, calm, comfortable Eyes: PERRL, lids and conjunctivae normal ENMT: Mucous membranes are moist. Posterior pharynx clear of any exudate or lesions. Neck: normal, supple, no masses, no thyromegaly Respiratory: clear to auscultation bilaterally, no wheezing, no crackles. Normal respiratory effort. No accessory muscle use.  Cardiovascular: Regular rate and rhythm, no murmurs / rubs / gallops. No extremity edema. 2+ pedal pulses. No carotid bruits.  Abdomen: no tenderness, no masses palpated. No hepatosplenomegaly. Bowel sounds positive.  Musculoskeletal: no clubbing / cyanosis.  Good ROM, no contractures. Normal muscle tone.  Skin: no rashes, lesions, ulcers. No induration Neurologic: CN 2-12 grossly intact. Sensation intact, DTR normal. Strength 5/5 in all 4.  Psychiatric: Normal judgment and insight. Alert and oriented x 3. Normal mood.   Labs on Admission: I have personally reviewed following labs and imaging studies  CBC: Recent Labs  Lab 03/20/2020 2114  WBC 7.5  NEUTROABS 6.6  HGB 13.5  HCT 44.5  MCV 93.1  PLT 850    Basic Metabolic Panel: Recent Labs  Lab 02/21/2020 2114  NA 142  K  4.2  CL 109  CO2 21*  GLUCOSE 92  BUN 43*  CREATININE 2.19*  CALCIUM 8.5*    GFR: Estimated Creatinine Clearance: 28.8 mL/min (A) (by C-G formula based on SCr of 2.19 mg/dL (H)).  Liver Function Tests: Recent Labs  Lab 03/09/2020 2114  AST 94*  ALT 59*  ALKPHOS 82  BILITOT 1.1  PROT 6.8  ALBUMIN 3.0*    Urine analysis:    Component Value Date/Time   COLORURINE YELLOW 03/05/2020 2114   APPEARANCEUR HAZY (A) 02/20/2020 2114   LABSPEC 1.018 03/17/2020 2114   PHURINE 5.0 02/24/2020 2114   GLUCOSEU NEGATIVE 02/26/2020 2114   HGBUR MODERATE (A) 03/10/2020 2114   BILIRUBINUR NEGATIVE 03/12/2020 2114        KETONESUR NEGATIVE 03/15/2020 2114   PROTEINUR >=300 (A) 03/07/2020 2114        NITRITE NEGATIVE 02/21/2020 2114   LEUKOCYTESUR NEGATIVE 03/05/2020 2114    Radiological Exams on Admission: CT Head Wo Contrast  Result  Date: 02/24/2020 CLINICAL DATA:  Mental status changes EXAM: CT HEAD WITHOUT CONTRAST TECHNIQUE: Contiguous axial images were obtained from the base of the skull through the vertex without intravenous contrast. COMPARISON:  None. FINDINGS: Brain: There is atrophy and chronic small vessel disease changes. No acute intracranial abnormality. Specifically, no hemorrhage, hydrocephalus, mass lesion, acute infarction, or significant intracranial injury. Vascular: No hyperdense vessel or unexpected calcification. Skull: No acute calvarial abnormality. Sinuses/Orbits: No acute findings Other: None IMPRESSION: Atrophy, chronic microvascular disease. No acute intracranial abnormality. Electronically Signed   By: Rolm Baptise M.D.   On: 03/20/2020 22:23   DG Chest Portable 1 View  Result Date: 03/18/2020 CLINICAL DATA:  87 year old male with shortness of breath. EXAM: PORTABLE CHEST 1 VIEW COMPARISON:  Chest radiograph dated 11/02/2010. FINDINGS: There is mild eventration of the left hemidiaphragm. Mid to lower lung field interstitial and hazy airspace density may  represent edema or pneumonia or combination. Clinical correlation is recommended. No large pleural scars and a small left pleural effusion may be present. No pneumothorax. Stable cardiac silhouette. No acute osseous pathology. IMPRESSION: Mid to lower lung field interstitial and hazy airspace density may represent edema or pneumonia or combination. Electronically Signed   By: Anner Crete M.D.   On: 03/03/2020 22:16    EKG: Independently reviewed.  Vent. rate 96 BPM PR interval * ms QRS duration 89 ms QT/QTc 353/447 ms P-R-T axes 38 -44 31 Sinus rhythm Left axis deviation Low voltage, precordial leads Minimal ST depression, lateral leads  Assessment/Plan Principal Problem:   Pneumonia due to COVID-19 virus Active Problems:   Class 2 obesity   Acute renal failure superimposed on stage 3b chronic kidney disease (HCC)   Proteinuria   Hypoalbuminemia    DVT prophylaxis: *** (Lovenox/Heparin/SCD's/anticoagulated/None (if comfort care) Code Status:   *** (Full/Partial (specify details) Family Communication:  *** (Specify name, relationship. Do not write "discussed with patient". Specify tel # if discussed over the phone) Disposition Plan:   Patient is from:  ***  Anticipated DC to:  ***  Anticipated DC date:  ***  Anticipated DC barriers: ***  Consults called:  *** (with names) Admission status:  *** (inpatient / obs / tele / medical floor / SDU)  Severity of Illness:  Reubin Milan MD [facility]  How to contact the [facility] Attending or Consulting provider [place] or covering provider during after hours 7P -7A, for this patient?   1. Check the care team in [facility] and look for a) attending/consulting [facility] provider listed and b) the [facility] team listed 2. Log into [email or web address] and use [place] universal password to access. If you do not have the password, please contact the hospital operator. 3. Locate the [facility] provider you are looking for under [facility] and  page to a number that you can be directly reached. 4. If you still have difficulty reaching the provider, please page the [facility] (Director on Call) for the Hospitalists listed on amion for assistance.  02/25/2020, 11:47 PM   This document was prepared using Dragon voice recognition software and may contain some unintended transcription errors.

## 2020-03-13 NOTE — ED Provider Notes (Signed)
Emergency Department Provider Note   I have reviewed the triage vital signs and the nursing notes.   HISTORY  Chief Complaint Fall   HPI Scott Harvey is a 87 y.o. male with past medical history reviewed below presents to the emergency department for evaluation of altered mental status with hypoxemia. Patient arrives by EMS. Family called after they could not get in touch with him and the patient was found to be lying on the bathroom floor. They report that he seemed more confused than normal. EMS found him to be satting 84% on room air and placed him on [place] O2. Patient appears shortness of breath but denies the symptoms to me. He denies any chest pain or headache. No sick contacts. He denies falls to me and states that he chose to get down on the bathroom floor because after he wanted to be.   Level 5 caveat: AMS  Past Medical History:  Diagnosis Date  . Arthritis   . Bladder cancer ([place])   . CKD (chronic kidney disease), stage III ([place])   . Dry skin    generalized itching ?d/t kidney disease  . H/O: GI bleed    d/t ASA  . History of renal cell carcinoma    1980's left nephrectomy  . HOH (hard of hearing)    right ear better than left  . Macular degeneration of both eyes   . Renal cyst, right   . Shingles 03/11/2019  . Solitary right kidney    acquired-- s/p left nephrectomy 1980's    Patient Active Problem List   Diagnosis Date Noted  . Shingles 03/11/2019    Past Surgical History:  Procedure Laterality Date  . APPENDECTOMY  1954  . CATARACT EXTRACTION W/ INTRAOCULAR LENS  IMPLANT, BILATERAL  2012  . CHOLECYSTECTOMY  1980  . CYSTOSCOPY WITH FULGERATION N/A 02/07/2016   Procedure: CYSTOSCOPY WITH FULGERATION AND BLADDER BIOPSY;  Surgeon: Cleon Gustin, MD;  Location: [facility];  Service: Urology;  Laterality: N/A;  . CYSTOSCOPY WITH RETROGRADE PYELOGRAM, URETEROSCOPY AND STENT PLACEMENT Right 01/01/2016   Procedure: CYSTOSCOPY WITH RETROGRADE  PYELOGRAM/URETERAL STENT PLACEMENT;  Surgeon: Cleon Gustin, MD;  Location: [facility];  Service: Urology;  Laterality: Right;  . CYSTOSCOPY WITH URETHRAL DILATATION N/A 01/01/2016   Procedure: CYSTOSCOPY WITH URETHRAL DILATATION;  Surgeon: Cleon Gustin, MD;  Location[facility];  Service: Urology;  Laterality: N/A;  . TOTAL NEPHRECTOMY Left 1980's   renal cell carcinoma  . TRANSURETHRAL RESECTION OF BLADDER TUMOR N/A 01/01/2016   Procedure: TRANSURETHRAL RESECTION OF BLADDER TUMOR (TURBT);  Surgeon: Cleon Gustin, MD;  Location: [facility];  Service: Urology;  Laterality: N/A;    Allergies Patient has no known allergies.  No family history on file.  Social History Social History   Tobacco Use  . Smoking status: Former Smoker    Types: Pipe    Quit date: 12/26/2005    Years since quitting: 14.2  . Smokeless tobacco: Never Used  Substance Use Topics  . Alcohol use: No  . Drug use: No    Review of Systems  Constitutional: No fever/chills Eyes: No visual changes. ENT: No sore throat. Cardiovascular: Denies chest pain. Respiratory: Denies shortness of breath. Gastrointestinal: No abdominal pain.  No nausea, no vomiting.  No diarrhea.  No constipation. Genitourinary: Negative for dysuria. Musculoskeletal: Negative for back pain. Skin: Negative for rash. Neurological: Negative for headaches, focal weakness or numbness.  10-point ROS otherwise negative.  ____________________________________________   PHYSICAL EXAM:  VITAL SIGNS: ED Triage Vitals  Enc Vitals Group     BP 02/24/2020 2101 128/86     Pulse Rate 03/04/2020 2101 (!) 102     Resp 02/27/2020 2101 (!) 27     Temp 03/17/2020 2101 98.2 F (36.8 C)     Temp Source 03/11/2020 2101 Oral     SpO2 03/05/2020 2101 91 %     Weight 02/21/2020 2102 256 lb (116.1 kg)     Height 03/01/2020 2102 5\' 9"  (1.753 m)   Constitutional: Alert but confused. Patient with increased  WOB on my initial assessment.  Eyes: Conjunctivae are normal.  Head: Atraumatic. Nose: No congestion/rhinnorhea. Mouth/Throat: Mucous membranes are moist.   Neck: No stridor.   Cardiovascular: Tachycardia. Good peripheral circulation. Grossly normal heart sounds.   Respiratory: Increased respiratory effort.  No retractions. Lungs with crackles at the bases bilaterally. Gastrointestinal: Soft and nontender. No distention.  Musculoskeletal: No lower extremity tenderness nor edema. No gross deformities of extremities. Neurologic:  Normal speech and language. Moving extremities equally with equal strength.  Skin:  Skin is warm, dry and intact. No rash noted.   ____________________________________________   LABS (all labs ordered are listed, but only abnormal results are displayed)  Labs Reviewed  SARS CORONAVIRUS 2 BY RT PCR ([place] [place] [facility]) - Abnormal; Notable for the following components:      Result Value   SARS Coronavirus 2 POSITIVE (*)    All other components within normal limits  CBC WITH DIFFERENTIAL/PLATELET - Abnormal; Notable for the following components:   Lymphs Abs 0.4 (*)    All other components within normal limits  URINALYSIS, ROUTINE W REFLEX MICROSCOPIC - Abnormal; Notable for the following components:   APPearance HAZY (*)    Hgb urine dipstick MODERATE (*)    Protein, ur >=300 (*)    Bacteria, UA RARE (*)    All other components within normal limits  D-DIMER, QUANTITATIVE (NOT AT [facility]) - Abnormal; Notable for the following components:   D-Dimer, Quant 2.98 (*)    All other components within normal limits  URINE CULTURE  CULTURE, BLOOD (ROUTINE X 2)  CULTURE, BLOOD (ROUTINE X 2)  ETHANOL  COMPREHENSIVE METABOLIC PANEL  LACTIC ACID, PLASMA  LACTIC ACID, PLASMA  PROCALCITONIN  LACTATE DEHYDROGENASE  FERRITIN  TRIGLYCERIDES  FIBRINOGEN  C-REACTIVE PROTEIN  TROPONIN I (HIGH SENSITIVITY)  TROPONIN I (HIGH SENSITIVITY)    ____________________________________________  EKG   EKG Interpretation  Date/Time:  Monday March 13 2020 21:22:56 EST Ventricular Rate:  96 PR Interval:    QRS Duration: 89 QT Interval:  353 QTC Calculation: 447 R Axis:   -44 Text Interpretation: Sinus rhythm Left axis deviation Low voltage, precordial leads Minimal ST depression, lateral leads No STEMI Confirmed by Nanda Quinton [phone]) on 02/19/2020 9:31:26 PM       ____________________________________________  RADIOLOGY  CT Head Wo Contrast  Result Date: 03/05/2020 CLINICAL DATA:  Mental status changes EXAM: CT HEAD WITHOUT CONTRAST TECHNIQUE: Contiguous axial images were obtained from the base of the skull through the vertex without intravenous contrast. COMPARISON:  None. FINDINGS: Brain: There is atrophy and chronic small vessel disease changes. No acute intracranial abnormality. Specifically, no hemorrhage, hydrocephalus, mass lesion, acute infarction, or significant intracranial injury. Vascular: No hyperdense vessel or unexpected calcification. Skull: No acute calvarial abnormality. Sinuses/Orbits: No acute findings Other: None IMPRESSION: Atrophy, chronic microvascular disease. No acute intracranial abnormality. Electronically Signed   By: Lennette Bihari  [place] M.D.   On: 02/29/2020 22:23   DG Chest Portable 1 View  Result Date: 02/21/2020 CLINICAL DATA:  87 year old male with shortness of breath. EXAM: PORTABLE CHEST 1 VIEW COMPARISON:  Chest radiograph dated 11/02/2010. FINDINGS: There is mild eventration of the left hemidiaphragm. Mid to lower lung field interstitial and hazy airspace density may represent edema or pneumonia or combination. Clinical correlation is recommended. No large pleural scars and a small left pleural effusion may be present. No pneumothorax. Stable cardiac silhouette. No acute osseous pathology. IMPRESSION: Mid to lower lung field interstitial and hazy airspace density may represent edema or pneumonia or  combination. Electronically Signed   By: Anner Crete M.D.   On: 03/12/2020 22:16    ____________________________________________   PROCEDURES  Procedure(s) performed:   .Critical Care Performed by: Margette Fast, MD Authorized by: Margette Fast, MD   Critical care provider statement:    Critical care time (minutes):  35   Critical care time was exclusive of:  Teaching time and separately billable procedures and treating other patients   Critical care was necessary to treat or prevent imminent or life-threatening deterioration of the following conditions:  Respiratory failure   Critical care was time spent personally by me on the following activities:  Discussions with consultants, evaluation of patient's response to treatment, examination of patient, ordering and performing treatments and interventions, ordering and review of laboratory studies, ordering and review of radiographic studies, pulse oximetry, re-evaluation of patient's condition, obtaining history from patient or surrogate, review of old charts, blood draw for specimens and development of treatment plan with patient or surrogate   I assumed direction of critical care for this patient from another provider in my specialty: no     Care discussed with: admitting provider     ____________________________________________   INITIAL IMPRESSION / [place] / ED COURSE  Pertinent labs & imaging results that were available during my care of the patient were reviewed by me and considered in my medical decision making (see chart for details).   Patient presents to the emergency department for evaluation of altered mental status with increased work of breathing and hypoxemia. Patient has no acute findings on his neurologic exam. His oxygen is low on room air and is currently on 4 L nasal cannula with sats still in the low 90s. Will discuss with RT regarding heated high flow. COVID-19 is a primary concern. Also  considering ACS, PE, bacterial pneumonia. Patient describes rash to his neck which he suspects is zoster. No clear zoster type rash in this area. Notes to have a history in brief chart review. Patient somewhat tangential with his thought process here which is his primary AMS presentation.   10:46 PM  CT scan of the head along with chest x-ray reviewed.  CT head with no acute findings.  Chest x-ray showing nonspecific findings concerning for viral/atypical pneumonia versus pulmonary edema.  Favor viral process clinically. Remaining labs are pending.   11:11 PM  Patient has come back Covid positive which correlates well with his clinical presentation including his encephalopathy.  I have added Decadron as well as remdesivir with some infiltrate on x-ray.  He remains stable on 7 L high flow nasal cannula.  CMP has resulted but not crossing into epic as of yet.  Creatinine at 2.19 with mild LFT elevation.  Potassium is normal.  D-dimer elevated which I would expect in the setting of COVID-19.  Lower suspicion clinically for PE.  Cannot CTA  with elevated creatinine. Plan for admit.   Discussed patient's case with TRH to request admission. Patient and family (if present) updated with plan. Care transferred to [facility] service.  I reviewed all nursing notes, vitals, pertinent old records, EKGs, labs, imaging (as available).  ____________________________________________  FINAL CLINICAL IMPRESSION(S) / ED DIAGNOSES  Final diagnoses:  Acute respiratory failure with hypoxia (HCC)  COVID-19     MEDICATIONS GIVEN DURING THIS VISIT:  Medications  dexamethasone (DECADRON) injection 10 mg (has no administration in time range)  remdesivir 100 mg in sodium chloride 0.9 % 100 mL IVPB (has no administration in time range)  remdesivir 100 mg in sodium chloride 0.9 % 100 mL IVPB (has no administration in time range)    Note:  This document was prepared using Dragon voice recognition software and may include  unintentional dictation errors.  Nanda Quinton, MD, [facility] Emergency Medicine    Kaizer Dissinger, [place], MD 03/14/20 [phone]

## 2020-03-14 ENCOUNTER — Encounter (HOSPITAL_COMMUNITY): Payer: Self-pay | Admitting: Internal Medicine

## 2020-03-14 DIAGNOSIS — Z8701 Personal history of pneumonia (recurrent): Secondary | ICD-10-CM | POA: Diagnosis not present

## 2020-03-14 DIAGNOSIS — E870 Hyperosmolality and hypernatremia: Secondary | ICD-10-CM | POA: Diagnosis not present

## 2020-03-14 DIAGNOSIS — I2782 Chronic pulmonary embolism: Secondary | ICD-10-CM | POA: Diagnosis not present

## 2020-03-14 DIAGNOSIS — I2609 Other pulmonary embolism with acute cor pulmonale: Secondary | ICD-10-CM | POA: Diagnosis not present

## 2020-03-14 DIAGNOSIS — J189 Pneumonia, unspecified organism: Secondary | ICD-10-CM | POA: Diagnosis not present

## 2020-03-14 DIAGNOSIS — N1832 Chronic kidney disease, stage 3b: Secondary | ICD-10-CM | POA: Diagnosis present

## 2020-03-14 DIAGNOSIS — E669 Obesity, unspecified: Secondary | ICD-10-CM | POA: Diagnosis present

## 2020-03-14 DIAGNOSIS — M069 Rheumatoid arthritis, unspecified: Secondary | ICD-10-CM | POA: Diagnosis present

## 2020-03-14 DIAGNOSIS — Z8551 Personal history of malignant neoplasm of bladder: Secondary | ICD-10-CM | POA: Diagnosis not present

## 2020-03-14 DIAGNOSIS — Z905 Acquired absence of kidney: Secondary | ICD-10-CM | POA: Diagnosis not present

## 2020-03-14 DIAGNOSIS — I517 Cardiomegaly: Secondary | ICD-10-CM | POA: Diagnosis not present

## 2020-03-14 DIAGNOSIS — F09 Unspecified mental disorder due to known physiological condition: Secondary | ICD-10-CM | POA: Diagnosis not present

## 2020-03-14 DIAGNOSIS — J690 Pneumonitis due to inhalation of food and vomit: Secondary | ICD-10-CM | POA: Diagnosis present

## 2020-03-14 DIAGNOSIS — Z87891 Personal history of nicotine dependence: Secondary | ICD-10-CM | POA: Diagnosis not present

## 2020-03-14 DIAGNOSIS — I959 Hypotension, unspecified: Secondary | ICD-10-CM | POA: Diagnosis not present

## 2020-03-14 DIAGNOSIS — T380X5A Adverse effect of glucocorticoids and synthetic analogues, initial encounter: Secondary | ICD-10-CM | POA: Diagnosis not present

## 2020-03-14 DIAGNOSIS — J90 Pleural effusion, not elsewhere classified: Secondary | ICD-10-CM | POA: Diagnosis not present

## 2020-03-14 DIAGNOSIS — Z85528 Personal history of other malignant neoplasm of kidney: Secondary | ICD-10-CM | POA: Diagnosis not present

## 2020-03-14 DIAGNOSIS — Z6837 Body mass index (BMI) 37.0-37.9, adult: Secondary | ICD-10-CM | POA: Diagnosis not present

## 2020-03-14 DIAGNOSIS — R059 Cough, unspecified: Secondary | ICD-10-CM | POA: Diagnosis not present

## 2020-03-14 DIAGNOSIS — U071 COVID-19: Secondary | ICD-10-CM | POA: Diagnosis present

## 2020-03-14 DIAGNOSIS — N400 Enlarged prostate without lower urinary tract symptoms: Secondary | ICD-10-CM | POA: Diagnosis present

## 2020-03-14 DIAGNOSIS — Z515 Encounter for palliative care: Secondary | ICD-10-CM | POA: Diagnosis not present

## 2020-03-14 DIAGNOSIS — N179 Acute kidney failure, unspecified: Secondary | ICD-10-CM | POA: Diagnosis not present

## 2020-03-14 DIAGNOSIS — G9341 Metabolic encephalopathy: Secondary | ICD-10-CM | POA: Diagnosis present

## 2020-03-14 DIAGNOSIS — B0229 Other postherpetic nervous system involvement: Secondary | ICD-10-CM | POA: Diagnosis present

## 2020-03-14 DIAGNOSIS — R918 Other nonspecific abnormal finding of lung field: Secondary | ICD-10-CM | POA: Diagnosis not present

## 2020-03-14 DIAGNOSIS — Z9049 Acquired absence of other specified parts of digestive tract: Secondary | ICD-10-CM | POA: Diagnosis not present

## 2020-03-14 DIAGNOSIS — J9811 Atelectasis: Secondary | ICD-10-CM | POA: Diagnosis not present

## 2020-03-14 DIAGNOSIS — Z66 Do not resuscitate: Secondary | ICD-10-CM | POA: Diagnosis not present

## 2020-03-14 DIAGNOSIS — Z7189 Other specified counseling: Secondary | ICD-10-CM | POA: Diagnosis not present

## 2020-03-14 DIAGNOSIS — E8809 Other disorders of plasma-protein metabolism, not elsewhere classified: Secondary | ICD-10-CM | POA: Diagnosis present

## 2020-03-14 DIAGNOSIS — J9601 Acute respiratory failure with hypoxia: Secondary | ICD-10-CM | POA: Diagnosis present

## 2020-03-14 DIAGNOSIS — J9690 Respiratory failure, unspecified, unspecified whether with hypoxia or hypercapnia: Secondary | ICD-10-CM | POA: Diagnosis not present

## 2020-03-14 DIAGNOSIS — R0600 Dyspnea, unspecified: Secondary | ICD-10-CM | POA: Diagnosis not present

## 2020-03-14 DIAGNOSIS — J1282 Pneumonia due to coronavirus disease 2019: Secondary | ICD-10-CM | POA: Diagnosis present

## 2020-03-14 LAB — COMPREHENSIVE METABOLIC PANEL
ALT: 53 U/L — ABNORMAL HIGH (ref 0–44)
AST: 85 U/L — ABNORMAL HIGH (ref 15–41)
Albumin: 2.7 g/dL — ABNORMAL LOW (ref 3.5–5.0)
Alkaline Phosphatase: 73 U/L (ref 38–126)
Anion gap: 12 (ref 5–15)
BUN: 46 mg/dL — ABNORMAL HIGH (ref 8–23)
CO2: 21 mmol/L — ABNORMAL LOW (ref 22–32)
Calcium: 8.5 mg/dL — ABNORMAL LOW (ref 8.9–10.3)
Chloride: 111 mmol/L (ref 98–111)
Creatinine, Ser: 2.13 mg/dL — ABNORMAL HIGH (ref 0.61–1.24)
GFR, Estimated: 29 mL/min — ABNORMAL LOW (ref >60)
Glucose, Bld: 109 mg/dL — ABNORMAL HIGH (ref 70–99)
Potassium: 4.5 mmol/L (ref 3.5–5.1)
Sodium: 144 mmol/L (ref 135–145)
Total Bilirubin: 0.6 mg/dL (ref 0.3–1.2)
Total Protein: 6.4 g/dL — ABNORMAL LOW (ref 6.5–8.1)

## 2020-03-14 LAB — CBC WITH DIFFERENTIAL/PLATELET
Abs Immature Granulocytes: 0.03 10*3/uL (ref 0.00–0.07)
Basophils Absolute: 0 10*3/uL (ref 0.0–0.1)
Basophils Relative: 0 %
Eosinophils Absolute: 0 10*3/uL (ref 0.0–0.5)
Eosinophils Relative: 0 %
HCT: 44 % (ref 39.0–52.0)
Hemoglobin: 13.1 g/dL (ref 13.0–17.0)
Immature Granulocytes: 1 %
Lymphocytes Relative: 6 %
Lymphs Abs: 0.3 10*3/uL — ABNORMAL LOW (ref 0.7–4.0)
MCH: 28.1 pg (ref 26.0–34.0)
MCHC: 29.8 g/dL — ABNORMAL LOW (ref 30.0–36.0)
MCV: 94.2 fL (ref 80.0–100.0)
Monocytes Absolute: 0.2 10*3/uL (ref 0.1–1.0)
Monocytes Relative: 3 %
Neutro Abs: 5 10*3/uL (ref 1.7–7.7)
Neutrophils Relative %: 90 %
Platelets: 167 10*3/uL (ref 150–400)
RBC: 4.67 MIL/uL (ref 4.22–5.81)
RDW: 14.8 % (ref 11.5–15.5)
WBC: 5.6 10*3/uL (ref 4.0–10.5)
nRBC: 0 % (ref 0.0–0.2)

## 2020-03-14 LAB — MRSA PCR SCREENING: MRSA by PCR: NEGATIVE

## 2020-03-14 LAB — D-DIMER, QUANTITATIVE: D-Dimer, Quant: 2.9 ug/mL-FEU — ABNORMAL HIGH (ref 0.00–0.50)

## 2020-03-14 LAB — FERRITIN: Ferritin: 827 ng/mL — ABNORMAL HIGH (ref 24–336)

## 2020-03-14 LAB — LACTIC ACID, PLASMA: Lactic Acid, Venous: 1.1 mmol/L (ref 0.5–1.9)

## 2020-03-14 LAB — C-REACTIVE PROTEIN: CRP: 17.5 mg/dL — ABNORMAL HIGH (ref <1.0)

## 2020-03-14 MED ORDER — GUAIFENESIN-DM 100-10 MG/5ML PO SYRP: 10.0000 mL | ORAL_SOLUTION | ORAL | Status: DC | PRN

## 2020-03-14 MED ORDER — SODIUM CHLORIDE 0.45 % IV SOLN: INTRAVENOUS | Status: DC

## 2020-03-14 MED ORDER — PROCHLORPERAZINE EDISYLATE 10 MG/2ML IJ SOLN: 5.0000 mg | INTRAMUSCULAR | Status: DC | PRN

## 2020-03-14 MED ORDER — CHLORHEXIDINE GLUCONATE CLOTH 2 % EX PADS
6.0000 | MEDICATED_PAD | Freq: Every day | CUTANEOUS | Status: DC
  Administered 2020-03-14 – 2020-03-23 (×10): 6 via TOPICAL

## 2020-03-14 MED ORDER — HYDROCOD POLST-CPM POLST ER 10-8 MG/5ML PO SUER
5.0000 mL | Freq: Two times a day (BID) | ORAL | Status: DC | PRN
  Administered 2020-03-16 (×2): 5 mL via ORAL
  Filled 2020-03-14 (×2): qty 5

## 2020-03-14 MED ORDER — DEXAMETHASONE SODIUM PHOSPHATE 10 MG/ML IJ SOLN
6.0000 mg | INTRAMUSCULAR | Status: DC
  Administered 2020-03-15 – 2020-03-16 (×3): 6 mg via INTRAVENOUS
  Filled 2020-03-14 (×3): qty 1

## 2020-03-14 MED ORDER — ACETAMINOPHEN 650 MG RE SUPP
650.0000 mg | Freq: Four times a day (QID) | RECTAL | Status: DC | PRN
  Administered 2020-03-20: 650 mg via RECTAL
  Filled 2020-03-14: qty 1

## 2020-03-14 MED ORDER — ENOXAPARIN SODIUM 30 MG/0.3ML ~~LOC~~ SOLN
30.0000 mg | SUBCUTANEOUS | Status: DC
  Administered 2020-03-14 – 2020-03-16 (×3): 30 mg via SUBCUTANEOUS
  Filled 2020-03-14 (×3): qty 0.3

## 2020-03-14 MED ORDER — SODIUM CHLORIDE 0.9 % IV SOLN: INTRAVENOUS | Status: DC

## 2020-03-14 MED ORDER — ACETAMINOPHEN 325 MG PO TABS
650.0000 mg | ORAL_TABLET | Freq: Four times a day (QID) | ORAL | Status: DC | PRN
  Administered 2020-03-18: 650 mg via ORAL
  Filled 2020-03-14: qty 2

## 2020-03-14 MED ORDER — ASCORBIC ACID 500 MG PO TABS
500.0000 mg | ORAL_TABLET | Freq: Every day | ORAL | Status: DC
  Administered 2020-03-14 – 2020-03-18 (×5): 500 mg via ORAL
  Filled 2020-03-14 (×6): qty 1

## 2020-03-14 MED ORDER — ALBUTEROL SULFATE HFA 108 (90 BASE) MCG/ACT IN AERS
2.0000 | INHALATION_SPRAY | Freq: Four times a day (QID) | RESPIRATORY_TRACT | Status: DC
  Administered 2020-03-14 – 2020-03-16 (×9): 2 via RESPIRATORY_TRACT
  Filled 2020-03-14: qty 6.7

## 2020-03-14 MED ORDER — ZINC SULFATE 220 (50 ZN) MG PO CAPS
220.0000 mg | ORAL_CAPSULE | Freq: Every day | ORAL | Status: DC
  Administered 2020-03-14 – 2020-03-19 (×6): 220 mg via ORAL
  Filled 2020-03-14 (×6): qty 1

## 2020-03-14 MED ORDER — BARICITINIB 1 MG PO TABS
1.0000 mg | ORAL_TABLET | Freq: Every day | ORAL | Status: DC
  Administered 2020-03-14 – 2020-03-16 (×3): 1 mg via ORAL
  Filled 2020-03-14 (×7): qty 1

## 2020-03-14 NOTE — ED Notes (Signed)
Pt brief and linen changed  

## 2020-03-14 NOTE — ED Notes (Signed)
Pt found with O2 out of his nose. O2 replaced at 8L HFNC. Pt sats increased to 91%

## 2020-03-14 NOTE — ED Notes (Signed)
Pt keeps taking oxygen out of nose and drops into the 80s. Pt encouraged to keep oxygen on. Will continue to monitor pt and O2 levels.

## 2020-03-14 NOTE — Progress Notes (Signed)
PROGRESS NOTE   Scott Harvey  YPP:[id number] DOB: 04-23-1928 DOA: 02/25/2020 PCP: Asencion Noble, MD   Chief Complaint  Patient presents with  . Fall   Level of care: Telemetry  Brief Admission History:  87 y.o. male with medical history significant of osteoarthritis, bladder cancer, stage III CKD, dry skin, history of GI bleed due to aspirin use, history of renal cell carcinoma with left nephrectomy, impaired hearing, macular degeneration of both eyes, right renal cyst, history of herpes zoster who is coming to the emergency department via EMS after family last saw him on Saturday, but were concerned today since the patient had not been answering the phone and was subsequently found altered on the bathroom floor.  EMS described his oxygen saturation was 84% on room air and placed on supplemental oxygen.    Assessment & Plan:   Principal Problem:   Pneumonia due to COVID-19 virus Active Problems:   Class 2 obesity   Acute renal failure superimposed on stage 3b chronic kidney disease (HCC)   Proteinuria   Hypoalbuminemia   Acute respiratory failure with hypoxia (HCC)   1. Acute respiratory failure with hypoxia secondary to Covid pneumonia - Continue remdesivir, IV steroid, bronchodilators and supportive measures.  Follow inflammatory markers.  Wean oxygen as able.   2. AKI on CKD stage 3b - Treating with IV fluid hydration.  Recheck in AM.    DVT prophylaxis: enoxaparin Code Status: full  Family Communication: T/C to son 1/25 wrong number listed Disposition: TBD Status is: Inpatient  Remains inpatient appropriate because:IV treatments appropriate due to intensity of illness or inability to take PO and Inpatient level of care appropriate due to severity of illness   Dispo: The patient is from: Home              Anticipated d/c is to: SNF              Anticipated d/c date is: 1 day              Patient currently is medically stable to d/c.   Difficult to place patient  No   Consultants:     Procedures:     Antimicrobials:     Subjective: Pt reports malaise and shortness of breath.    Objective: Vitals:   03/14/20 1500 03/14/20 1524 03/14/20 1600 03/14/20 1608  BP: (!) 117/91  93/67   Pulse: 87 85 81 86  Resp: 19 (!) 21 (!) 23 (!) 21  Temp:    97.9 F (36.6 C)  TempSrc:    Oral  SpO2: 91% 90% 91% 90%  Weight:      Height:        Intake/Output Summary (Last 24 hours) at 03/14/2020 1730 Last data filed at 03/14/2020 1603 Gross per 24 hour  Intake 681.57 ml  Output -  Net 681.57 ml   Filed Weights   02/27/2020 2102 03/14/20 1243  Weight: 116.1 kg 107.1 kg    Examination:  General exam: frail elderly male, awake, Appears calm and comfortable  Respiratory system: bibasilar rales. Mild increased WOB. Cardiovascular system: normal S1 & S2 heard. No JVD, murmurs, rubs, gallops or clicks. No pedal edema. Gastrointestinal system: Abdomen is nondistended, soft and nontender. No organomegaly or masses felt. Normal bowel sounds heard. Central nervous system: Alert and oriented. No focal neurological deficits. Extremities: Symmetric 5 x 5 power. Skin: No rashes, lesions or ulcers Psychiatry: Judgement and insight appear normal. Mood & affect appropriate.   Data Reviewed: I  have personally reviewed following labs and imaging studies  CBC: Recent Labs  Lab 03/07/2020 2114 03/14/20 0729  WBC 7.5 5.6  NEUTROABS 6.6 5.0  HGB 13.5 13.1  HCT 44.5 44.0  MCV 93.1 94.2  PLT 170 992    Basic Metabolic Panel: Recent Labs  Lab 03/19/2020 2114 03/14/20 0729  NA 142 144  K 4.2 4.5  CL 109 111  CO2 21* 21*  GLUCOSE 92 109*  BUN 43* 46*  CREATININE 2.19* 2.13*  CALCIUM 8.5* 8.5*    GFR: Estimated Creatinine Clearance: 28.4 mL/min (A) (by C-G formula based on SCr of 2.13 mg/dL (H)).  Liver Function Tests: Recent Labs  Lab 02/24/2020 2114 03/14/20 0729  AST 94* 85*  ALT 59* 53*  ALKPHOS 82 73  BILITOT 1.1 0.6  PROT 6.8 6.4*   ALBUMIN 3.0* 2.7*    CBG: No results for input(s): GLUCAP in the last 168 hours.  Recent Results (from the past 240 hour(s))  SARS Coronavirus 2 by RT PCR (hospital order, performed in [facility] hospital lab) Nasopharyngeal Nasopharyngeal Swab     Status: Abnormal   Collection Time: 03/15/2020  9:15 PM   Specimen: Nasopharyngeal Swab  Result Value Ref Range Status   SARS Coronavirus 2 POSITIVE (A) NEGATIVE Final    Comment: RESULT CALLED TO, READ BACK BY AND VERIFIED WITH: R THOMPSON,RN@2304  02/21/2020 MKELLY (NOTE) SARS-CoV-2 target nucleic acids are DETECTED  SARS-CoV-2 RNA is generally detectable in upper respiratory specimens  during the acute phase of infection.  Positive results are indicative  of the presence of the identified virus, but do not rule out bacterial infection or co-infection with other pathogens not detected by the test.  Clinical correlation with patient history and  other diagnostic information is necessary to determine patient infection status.  The expected result is negative.  Fact Sheet for Patients:   StrictlyIdeas.no   Fact Sheet for Healthcare Providers:   BankingDealers.co.za    This test is not yet approved or cleared by the [place] FDA and  has been authorized for detection and/or diagnosis of SARS-CoV-2 by FDA under an Emergency Use Authorization (EUA).  This EUA will remain in effect (meaning this te st can be used) for the duration of  the COVID-19 declaration under Section 564(b)(1) of the Act, 21 U.S.C. section 360-bbb-3(b)(1), unless the authorization is terminated or revoked sooner.  Performed at [facility], [address], [place], [place] [address]   Blood Culture (routine x 2)     Status: None (Preliminary result)   Collection Time: 02/23/2020 11:42 PM   Specimen: BLOOD  Result Value Ref Range Status   Specimen Description BLOOD LEFT ANTECUBITAL  Final   Special Requests   Final     BOTTLES DRAWN AEROBIC AND ANAEROBIC Blood Culture adequate volume   Culture   Final    NO GROWTH < 24 HOURS Performed at [facility], [address], [place], [place] [address]    Report Status PENDING  Incomplete  Blood Culture (routine x 2)     Status: None (Preliminary result)   Collection Time: 02/24/2020 11:42 PM   Specimen: BLOOD RIGHT HAND  Result Value Ref Range Status   Specimen Description BLOOD RIGHT HAND  Final   Special Requests   Final    BOTTLES DRAWN AEROBIC AND ANAEROBIC Blood Culture adequate volume   Culture   Final    NO GROWTH < 24 HOURS Performed at [facility], [address] [place] [address], [place], [place] [address]  Report Status PENDING  Incomplete  MRSA PCR Screening     Status: None   Collection Time: 03/14/20 12:48 PM   Specimen: Nasal Mucosa; Nasopharyngeal  Result Value Ref Range Status   MRSA by PCR NEGATIVE NEGATIVE Final    Comment:        The GeneXpert MRSA Assay (FDA approved for NASAL specimens only), is one component of a comprehensive MRSA colonization surveillance program. It is not intended to diagnose MRSA infection nor to guide or monitor treatment for MRSA infections. Performed at [facility], [address], [place], [place] [address]      Radiology Studies: CT Head Wo Contrast  Result Date: 03/07/2020 CLINICAL DATA:  Mental status changes EXAM: CT HEAD WITHOUT CONTRAST TECHNIQUE: Contiguous axial images were obtained from the base of the skull through the vertex without intravenous contrast. COMPARISON:  None. FINDINGS: Brain: There is atrophy and chronic small vessel disease changes. No acute intracranial abnormality. Specifically, no hemorrhage, hydrocephalus, mass lesion, acute infarction, or significant intracranial injury. Vascular: No hyperdense vessel or unexpected calcification. Skull: No acute calvarial abnormality. Sinuses/Orbits: No acute findings Other: None IMPRESSION: Atrophy, chronic microvascular disease. No acute  intracranial abnormality. Electronically Signed   By: Rolm Baptise M.D.   On: 03/12/2020 22:23   DG Chest Portable 1 View  Result Date: 03/08/2020 CLINICAL DATA:  87 year old male with shortness of breath. EXAM: PORTABLE CHEST 1 VIEW COMPARISON:  Chest radiograph dated 11/02/2010. FINDINGS: There is mild eventration of the left hemidiaphragm. Mid to lower lung field interstitial and hazy airspace density may represent edema or pneumonia or combination. Clinical correlation is recommended. No large pleural scars and a small left pleural effusion may be present. No pneumothorax. Stable cardiac silhouette. No acute osseous pathology. IMPRESSION: Mid to lower lung field interstitial and hazy airspace density may represent edema or pneumonia or combination. Electronically Signed   By: Anner Crete M.D.   On: 03/17/2020 22:16    Scheduled Meds: . albuterol  2 puff Inhalation Q6H  . vitamin C  500 mg Oral Daily  . baricitinib  1 mg Oral Daily  . Chlorhexidine Gluconate Cloth  6 each Topical Daily  . dexamethasone (DECADRON) injection  6 mg Intravenous Q24H  . enoxaparin (LOVENOX) injection  30 mg Subcutaneous Q24H  . zinc sulfate  220 mg Oral Daily   Continuous Infusions: . sodium chloride Stopped (03/14/20 1522)  . remdesivir 100 mg in NS 100 mL Stopped (03/14/20 1547)     LOS: 0 days   Time spent: 36 mins  Clanford Wynetta Emery, MD How to contact the [facility] Attending or Consulting provider [place] or covering provider during after hours [place], for this patient?  1. Check the care team in [facility] and look for a) attending/consulting TRH provider listed and b) the [facility] team listed 2. Log into [email or web address] and use [place]'s universal password to access. If you do not have the password, please contact the hospital operator. 3. Locate the [facility] provider you are looking for under [facility] and page to a number that you can be directly reached. 4. If you still have difficulty reaching the  provider, please page the  [facility] (Director on Call) for the Hospitalists listed on amion for assistance.  03/14/2020, 5:30 PM

## 2020-03-15 DIAGNOSIS — J1282 Pneumonia due to coronavirus disease 2019: Secondary | ICD-10-CM | POA: Diagnosis not present

## 2020-03-15 DIAGNOSIS — U071 COVID-19: Secondary | ICD-10-CM | POA: Diagnosis not present

## 2020-03-15 LAB — COMPREHENSIVE METABOLIC PANEL
ALT: 47 U/L — ABNORMAL HIGH (ref 0–44)
AST: 66 U/L — ABNORMAL HIGH (ref 15–41)
Albumin: 2.4 g/dL — ABNORMAL LOW (ref 3.5–5.0)
Alkaline Phosphatase: 65 U/L (ref 38–126)
Anion gap: 10 (ref 5–15)
BUN: 63 mg/dL — ABNORMAL HIGH (ref 8–23)
CO2: 20 mmol/L — ABNORMAL LOW (ref 22–32)
Calcium: 8.2 mg/dL — ABNORMAL LOW (ref 8.9–10.3)
Chloride: 113 mmol/L — ABNORMAL HIGH (ref 98–111)
Creatinine, Ser: 2.19 mg/dL — ABNORMAL HIGH (ref 0.61–1.24)
GFR, Estimated: 28 mL/min — ABNORMAL LOW (ref >60)
Glucose, Bld: 129 mg/dL — ABNORMAL HIGH (ref 70–99)
Potassium: 3.9 mmol/L (ref 3.5–5.1)
Sodium: 143 mmol/L (ref 135–145)
Total Bilirubin: 0.5 mg/dL (ref 0.3–1.2)
Total Protein: 5.7 g/dL — ABNORMAL LOW (ref 6.5–8.1)

## 2020-03-15 LAB — CBC WITH DIFFERENTIAL/PLATELET
Abs Immature Granulocytes: 0.03 10*3/uL (ref 0.00–0.07)
Basophils Absolute: 0 10*3/uL (ref 0.0–0.1)
Basophils Relative: 0 %
Eosinophils Absolute: 0 10*3/uL (ref 0.0–0.5)
Eosinophils Relative: 0 %
HCT: 41.3 % (ref 39.0–52.0)
Hemoglobin: 12.4 g/dL — ABNORMAL LOW (ref 13.0–17.0)
Immature Granulocytes: 1 %
Lymphocytes Relative: 6 %
Lymphs Abs: 0.3 10*3/uL — ABNORMAL LOW (ref 0.7–4.0)
MCH: 27.9 pg (ref 26.0–34.0)
MCHC: 30 g/dL (ref 30.0–36.0)
MCV: 93 fL (ref 80.0–100.0)
Monocytes Absolute: 0.3 10*3/uL (ref 0.1–1.0)
Monocytes Relative: 5 %
Neutro Abs: 4.8 10*3/uL (ref 1.7–7.7)
Neutrophils Relative %: 88 %
Platelets: 189 10*3/uL (ref 150–400)
RBC: 4.44 MIL/uL (ref 4.22–5.81)
RDW: 14.7 % (ref 11.5–15.5)
WBC: 5.4 10*3/uL (ref 4.0–10.5)
nRBC: 0 % (ref 0.0–0.2)

## 2020-03-15 LAB — D-DIMER, QUANTITATIVE: D-Dimer, Quant: 2.33 ug/mL-FEU — ABNORMAL HIGH (ref 0.00–0.50)

## 2020-03-15 LAB — C-REACTIVE PROTEIN: CRP: 12.4 mg/dL — ABNORMAL HIGH (ref <1.0)

## 2020-03-15 LAB — PHOSPHORUS: Phosphorus: 4.2 mg/dL (ref 2.5–4.6)

## 2020-03-15 LAB — FERRITIN: Ferritin: 885 ng/mL — ABNORMAL HIGH (ref 24–336)

## 2020-03-15 LAB — MAGNESIUM: Magnesium: 2 mg/dL (ref 1.7–2.4)

## 2020-03-15 NOTE — TOC Progression Note (Signed)
Transition of Care [facility]) - Progression Note    Patient Details  Name: Scott Harvey MRN: [id number] Date of Birth: 1928/06/16  Transition of Care [facility]) CM/SW Contact  Salome Arnt, [place] Phone Number: 03/15/2020, 3:25 PM  Clinical Narrative:  Pt will need home O2 and rolling walker at d/c. Unable to reach pt to discuss. LCSW discussed with son who is agreeable to refer to [facility]. Referred to [facility] with [facility]. TOC will follow up in AM.       Expected Discharge Plan: [place] Barriers to Discharge: Continued Medical Work up  Expected Discharge Plan and Services Expected Discharge Plan: [place] In-house Referral: Clinical Social Work   Post Acute Care Choice: [place] arrangements for the past 2 months: Single Family Home                 DME Arranged: N/A         HH Arranged: PT,RN,Social Work [place]: [place] Date [place]: 03/15/20 Time [place]: 1257 Representative spoke with at [place]: [place] ([place]) Interventions    Readmission Risk Interventions No flowsheet data found.

## 2020-03-15 NOTE — Progress Notes (Signed)
    SATURATION QUALIFICATIONS: (Thisnote is usedto comply with regulatory documentation for home oxygen)  Patient Saturations on Room Air at Rest =88%  Patient Saturations on Room Air while Ambulating =84%  Patient Saturations on2Liters of oxygen while Ambulating = 92 %  Patient needs continuous O2 at 2 L/min continuously via nasal cannula with humidifier, with gaseous portability and conserving device    Diagnosis--- Covid respiratory infection with acute hypoxic respiratory failure in the setting of Covid pneumonia

## 2020-03-15 NOTE — TOC Initial Note (Addendum)
Transition of Care [facility]) - Initial/Assessment Note    Patient Details  Name: Scott Harvey MRN: [id number] Date of Birth: July 31, 1928  Transition of Care [facility]) CM/SW Contact:    Salome Arnt, [place] Phone Number: 03/15/2020, 1:05 PM  Clinical Narrative:   Pt admitted with pneumonia due to COVID-19. LCSW attempted to reach pt, but unable. LCSW spoke with pt's son, Octavia Bruckner who reports pt lives alone and has been independent until illness. Pt ambulates with cane at baseline. Tim and his sister are involved and help pt as needed. PT evaluated pt and recommend SNF. LCSW discussed SNF with son and he was agreeable. He understands that [place] of county due to lack of [place] SNF beds.   Update: MD states he discussed placement with pt and he refuses. Referred to [facility] who accepts for PT, RN, and SW. Son updated. TOC will continue to follow.                  Expected Discharge Plan: Skilled Nursing Facility Barriers to Discharge: Continued Medical Work up   Patient Goals and CMS Choice Patient states their goals for this hospitalization and ongoing recovery are:: return home   Choice offered to / list presented to : Adult Children  Expected Discharge Plan and Services Expected Discharge Plan: [place] In-house Referral: Clinical Social Work   Post Acute Care Choice: [place] arrangements for the past 2 months: [place]                 DME Arranged: N/A         HH Arranged: PT,RN,Social Work [place] Agency: [place] Date [place]: 03/15/20 Time [place]: 1257 Representative spoke with at [place]: Tommi Rumps  Prior Living Arrangements/Services Living arrangements for the past 2 months: [place] Lives with:: Self Patient language and need for interpreter reviewed:: Yes Do you feel safe going back to the place where you live?: Yes      Need for Family Participation in Patient  Care: Yes (Comment)   Current home services: DME (cane) Criminal Activity/Legal Involvement Pertinent to Current Situation/Hospitalization: No - Comment as needed  Activities of Daily Living Home Assistive Devices/Equipment: Cane (specify quad or straight) ADL Screening (condition at time of admission) Patient's cognitive ability adequate to safely complete daily activities?: Yes Is the patient deaf or have difficulty hearing?: Yes Does the patient have difficulty seeing, even when wearing glasses/contacts?: Yes Does the patient have difficulty concentrating, remembering, or making decisions?: Yes Patient able to express need for assistance with ADLs?: Yes Does the patient have difficulty dressing or bathing?: No Independently performs ADLs?: Yes (appropriate for developmental age) Does the patient have difficulty walking or climbing stairs?: Yes Weakness of Legs: Both Weakness of Arms/Hands: None  Permission Sought/Granted                  Emotional Assessment         Alcohol / Substance Use: Not Applicable Psych Involvement: No (comment)  Admission diagnosis:  Acute respiratory failure with hypoxia ([place]) [J96.01] Pneumonia due to COVID-19 virus [U07.1, J12.82] COVID-19 [U07.1] Patient Active Problem List   Diagnosis Date Noted  . Acute respiratory failure with hypoxia ([place]) 03/14/2020  . Pneumonia due to COVID-19 virus 03/06/2020  . Class 2 obesity 03/08/2020  . Acute renal failure superimposed on stage 3b chronic kidney disease ([place]) 02/28/2020  . Proteinuria 03/06/2020  . Hypoalbuminemia 03/08/2020  .  Shingles 03/11/2019   PCP:  Asencion Noble, MD Pharmacy:   [facility] [phone] - [place], [place] AT [place] [address] [place] [address] Phone: [phone] Fax: [phone]  [id number] - [place], [place] AT [facility] [place] [address] Phone:  [phone] Fax: [phone]     Social Determinants of Health (SDOH) Interventions    Readmission Risk Interventions No flowsheet data found.

## 2020-03-15 NOTE — Evaluation (Signed)
Physical Therapy Evaluation Patient Details Name: Scott Harvey MRN: [id number] DOB: [birth year] Today's Date: 03/15/2020   History of Present Illness  Scott Harvey is a 87 y.o. male with medical history significant of osteoarthritis, bladder cancer, stage III CKD, dry skin, history of GI bleed due to aspirin use, history of renal cell carcinoma with left nephrectomy, impaired hearing, macular degeneration of both eyes, right renal cyst, history of herpes zoster who is coming to the emergency department via EMS after family last saw him on Saturday, but were concerned today since the patient had not been answering the phone and was subsequently found altered on the bathroom floor.  EMS described his oxygen saturation was 84% on room air and placed in an osteochondroma oxygen.  Per triage notes, he is usually oriented x4, but when seen, the patient was oriented to name, place, partially oriented to time/date and situation.  He denied headache, sore throat, chest pain, abdominal or back pain.    Clinical Impression  Patient demonstrates slow labored movement for sitting up at bedside, unable to stand without AD due to BLE weakness, at high risk for falls and limited to a few side steps at bedside due to fatigue and SpO2 dropping from 90% to 85% while on 2 LPM O2.  Patient tolerated sitting up in chair after therapy - RN aware.  Patient will benefit from continued physical therapy in hospital and recommended venue below to increase strength, balance, endurance for safe ADLs and gait.      Follow Up Recommendations SNF;Supervision for mobility/OOB;Supervision/Assistance - 24 hour    Equipment Recommendations  Rolling walker with 5" wheels    Recommendations for Other Services       Precautions / Restrictions Precautions Precautions: Fall Restrictions Weight Bearing Restrictions: No      Mobility  Bed Mobility Overal bed mobility: Needs Assistance Bed Mobility: Supine to Sit      Supine to sit: Min assist     General bed mobility comments: increased time, labored movement    Transfers Overall transfer level: Needs assistance Equipment used: Rolling walker (2 wheeled) Transfers: Sit to/from [facility] Sit to Stand: Min assist;Mod assist Stand pivot transfers: Min assist;Mod assist       General transfer comment: unable to complete sit to stands without AD due to BLE weakness, required use of RW for safety  Ambulation/Gait Ambulation/Gait assistance: Mod assist Gait Distance (Feet): 4 Feet Assistive device: Rolling walker (2 wheeled) Gait Pattern/deviations: Decreased step length - right;Decreased step length - left;Decreased stride length Gait velocity: decreased   General Gait Details: limited to 4-5 slow labored unsteady side steps due to c/o fatigue and generalized weakness  Stairs            Wheelchair Mobility    Modified Rankin (Stroke Patients Only)       Balance Overall balance assessment: Needs assistance Sitting-balance support: Feet supported;No upper extremity supported Sitting balance-Leahy Scale: Good Sitting balance - Comments: seated at EOB   Standing balance support: During functional activity;No upper extremity supported Standing balance-Leahy Scale: Poor Standing balance comment: fair using RW                             Pertinent Vitals/Pain Pain Assessment: No/denies pain    Home Living Family/patient expects to be discharged to:: Private residence Living Arrangements: Alone Available Help at Discharge: Family;Available PRN/intermittently Type of Home: House Home Access: Stairs to enter Entrance Stairs-Rails:  Right;Left Entrance Stairs-Number of Steps: 5 Home Layout: One level Home Equipment: Cane - single point Additional Comments: all information per patient    Prior Function Level of Independence: Independent with assistive device(s)         Comments: household and  short distanced community ambulator using [facility]     Hand Dominance        Extremity/Trunk Assessment   Upper Extremity Assessment Upper Extremity Assessment: Generalized weakness    Lower Extremity Assessment Lower Extremity Assessment: Generalized weakness    Cervical / Trunk Assessment Cervical / Trunk Assessment: Normal  Communication   Communication: No difficulties  Cognition Arousal/Alertness: Awake/alert Behavior During Therapy: WFL for tasks assessed/performed Overall Cognitive Status: No family/caregiver present to determine baseline cognitive functioning                                        General Comments      Exercises     Assessment/Plan    PT Assessment Patient needs continued PT services  PT Problem List Decreased strength;Decreased activity tolerance;Decreased balance;Decreased mobility       PT Treatment Interventions Balance training;DME instruction;Gait training;Stair training;Functional mobility training;Therapeutic activities;Therapeutic exercise;Patient/family education    PT Goals (Current goals can be found in the Care Plan section)  Acute Rehab PT Goals Patient Stated Goal: return home with family to assist PT Goal Formulation: With patient Time For Goal Achievement: 03/29/20 Potential to Achieve Goals: Good    Frequency Min 3X/week   Barriers to discharge        Co-evaluation               AM-PAC PT "6 Clicks" Mobility  Outcome Measure Help needed turning from your back to your side while in a flat bed without using bedrails?: A Little Help needed moving from lying on your back to sitting on the side of a flat bed without using bedrails?: A Lot Help needed moving to and from a bed to a chair (including a wheelchair)?: A Lot Help needed standing up from a chair using your arms (e.g., wheelchair or bedside chair)?: A Lot Help needed to walk in hospital room?: A Lot Help needed climbing 3-5 steps with a  railing? : Total 6 Click Score: 12    End of Session Equipment Utilized During Treatment: Oxygen Activity Tolerance: Patient tolerated treatment well;Patient limited by fatigue Patient left: in chair;with call bell/phone within reach Nurse Communication: Mobility status PT Visit Diagnosis: Unsteadiness on feet (R26.81);Other abnormalities of gait and mobility (R26.89);Muscle weakness (generalized) (M62.81)    Time: 1583-0940 PT Time Calculation (min) (ACUTE ONLY): 27 min   Charges:   PT Evaluation $PT Eval Moderate Complexity: 1 Mod PT Treatments $Therapeutic Activity: 23-37 mins        2:00 PM, 03/15/20 Lonell Grandchild, MPT Physical Therapist with [facility] [phone] office [phone] mobile phone

## 2020-03-15 NOTE — Progress Notes (Signed)
PROGRESS NOTE   Scott Harvey  OHY:[id number] DOB: 11-Oct-1928 DOA: 03/05/2020 PCP: Asencion Noble, MD   Chief Complaint  Patient presents with  . Fall   Level of care: Telemetry  Brief Admission History:  87 y.o. male with medical history significant of osteoarthritis, bladder cancer, stage III CKD, dry skin, history of GI bleed due to aspirin use, history of renal cell carcinoma with left nephrectomy, impaired hearing, macular degeneration of both eyes, right renal cyst, history of herpes zoster who is coming to the emergency department via EMS after family last saw him on Saturday, but were concerned today since the patient had not been answering the phone and was subsequently found altered on the bathroom floor.  EMS described his oxygen saturation was 84% on room air and placed on supplemental oxygen.    Assessment & Plan:   Principal Problem:   Pneumonia due to COVID-19 virus Active Problems:   Class 2 obesity   Acute renal failure superimposed on stage 3b chronic kidney disease (HCC)   Proteinuria   Hypoalbuminemia   Acute respiratory failure with hypoxia (HCC)   1. Acute respiratory failure with hypoxia secondary to Covid pneumonia - Continue remdesivir, IV steroid, bronchodilators and supportive measures.  -Continue baricitinib, continue zinc and vitamin C Currently requiring only 2 L of oxygen via nasal cannula (PTA patient was not on home O2)  COVID-19 Labs  Recent Labs    02/19/2020 2114 03/18/2020 2259 03/14/20 0729 03/15/20 0447  DDIMER 2.98*  --  2.90* 2.33*  FERRITIN  --  961* 827* 885*  LDH 338*  --   --   --   CRP  --  15.8* 17.5* 12.4*    Lab Results  Component Value Date   SARSCOV2NAA POSITIVE (A) 03/12/2020   [place] Not Detected 12/23/2018     2. AKI on CKD stage 3b - Treating with IV fluid hydration.  Recheck in AM.   3)BPH--Restart Flomax  4) generalized weakness and deconditioning--- PT recommends SNF rehab patient and family are leaning  towards going home with home health, - awaiting final decision from family regarding disposition --Patient refusing to go to SNF rehab family trying to figure out if they can arrange for home health services and somebody to be with him at home  DVT prophylaxis: enoxaparin Code Status: full  Family Communication: Discussed with daughter Levada Dy on 03/15/2020 Disposition: TBD Status is: Inpatient  Remains inpatient appropriate because:IV treatments appropriate due to intensity of illness or inability to take PO and Inpatient level of care appropriate due to severity of illness   Dispo: The patient is from: Home              Anticipated d/c is to: SNF  Vs Home with [facility]              Anticipated d/c date is: 1 day              Patient currently is medically stable to d/c.   Difficult to place patient No --   Consultants:     Procedures:     Antimicrobials:     Subjective: -Respiratory symptoms continue to improve requiring 2 L of oxygen -Patient refusing to go to SNF rehab family trying to figure out if they can arrange for home health services and somebody to be with him at home  Objective: Vitals:   03/15/20 0700 03/15/20 0800 03/15/20 0900 03/15/20 1100  BP: (!) 145/79 140/87    Pulse: 83 68 82  71  Resp: (!) 24 (!) 23 20 19   Temp:  98.4 F (36.9 C)    TempSrc:  Oral    SpO2: 91% 92% 91% 95%  Weight:      Height:        Intake/Output Summary (Last 24 hours) at 03/15/2020 1500 Last data filed at 03/15/2020 1403 Gross per 24 hour  Intake 1384.63 ml  Output 750 ml  Net 634.63 ml   Filed Weights   03/12/2020 2102 03/14/20 1243  Weight: 116.1 kg 107.1 kg    Examination:  General exam: frail elderly male, awake, Appears calm and comfortable  Nose- [place] 2 L/min Respiratory system: bibasilar rales.  Fair air movement Cardiovascular system: normal S1 & S2 heard. No JVD, murmurs, rubs, gallops or clicks. No pedal edema. Gastrointestinal system: Abdomen is nondistended,  soft and nontender.  . Normal bowel sounds heard. Central nervous system: Alert and oriented. No focal neurological deficits. Extremities: Symmetric 5 x 5 power. Skin: No rashes, lesions or ulcers Psychiatry: Judgement and insight appear normal. Mood & affect appropriate.   Data Reviewed: I have personally reviewed following labs and imaging studies  CBC: Recent Labs  Lab 02/26/2020 2114 03/14/20 0729 03/15/20 0447  WBC 7.5 5.6 5.4  NEUTROABS 6.6 5.0 4.8  HGB 13.5 13.1 12.4*  HCT 44.5 44.0 41.3  MCV 93.1 94.2 93.0  PLT 170 167 086    Basic Metabolic Panel: Recent Labs  Lab 02/27/2020 2114 03/14/20 0729 03/15/20 0447  NA 142 144 143  K 4.2 4.5 3.9  CL 109 111 113*  CO2 21* 21* 20*  GLUCOSE 92 109* 129*  BUN 43* 46* 63*  CREATININE 2.19* 2.13* 2.19*  CALCIUM 8.5* 8.5* 8.2*  MG  --   --  2.0  PHOS  --   --  4.2    GFR: Estimated Creatinine Clearance: 27.6 mL/min (A) (by C-G formula based on SCr of 2.19 mg/dL (H)).  Liver Function Tests: Recent Labs  Lab 02/27/2020 2114 03/14/20 0729 03/15/20 0447  AST 94* 85* 66*  ALT 59* 53* 47*  ALKPHOS 82 73 65  BILITOT 1.1 0.6 0.5  PROT 6.8 6.4* 5.7*  ALBUMIN 3.0* 2.7* 2.4*    CBG: No results for input(s): GLUCAP in the last 168 hours.  Recent Results (from the past 240 hour(s))  Urine culture     Status: Abnormal (Preliminary result)   Collection Time: 03/08/2020  9:14 PM   Specimen: Urine, Clean Catch  Result Value Ref Range Status   Specimen Description   Final    URINE, CLEAN CATCH Performed at [facility], [address] [place], [place] [address]    Special Requests   Final    NONE Performed at [facility], [address], [place], [place] [address]    Culture (A)  Final    >=100,000 COLONIES/mL GROUP B STREP(S.AGALACTIAE)ISOLATED TESTING AGAINST S. AGALACTIAE NOT ROUTINELY PERFORMED DUE TO PREDICTABILITY OF AMP/PEN/VAN SUSCEPTIBILITY. CULTURE REINCUBATED FOR BETTER GROWTH Performed at [place], [place] [address], [place], [place] [address]    Report Status PENDING  Incomplete  SARS Coronavirus 2 by RT PCR (hospital order, performed in [facility] hospital lab) Nasopharyngeal Nasopharyngeal Swab     Status: Abnormal   Collection Time: 02/29/2020  9:15 PM   Specimen: Nasopharyngeal Swab  Result Value Ref Range Status   SARS Coronavirus 2 POSITIVE (A) NEGATIVE Final    Comment: RESULT CALLED TO, READ BACK BY AND VERIFIED WITH: R THOMPSON,RN@2304  03/12/2020 MKELLY (  NOTE) SARS-CoV-2 target nucleic acids are DETECTED  SARS-CoV-2 RNA is generally detectable in upper respiratory specimens  during the acute phase of infection.  Positive results are indicative  of the presence of the identified virus, but do not rule out bacterial infection or co-infection with other pathogens not detected by the test.  Clinical correlation with patient history and  other diagnostic information is necessary to determine patient infection status.  The expected result is negative.  Fact Sheet for Patients:   StrictlyIdeas.no   Fact Sheet for Healthcare Providers:   BankingDealers.co.za    This test is not yet approved or cleared by the [place] FDA and  has been authorized for detection and/or diagnosis of SARS-CoV-2 by FDA under an Emergency Use Authorization (EUA).  This EUA will remain in effect (meaning this te st can be used) for the duration of  the COVID-19 declaration under Section 564(b)(1) of the Act, 21 U.S.C. section 360-bbb-3(b)(1), unless the authorization is terminated or revoked sooner.  Performed at [facility], [address] [place], [place] [address]   Blood Culture (routine x 2)     Status: None (Preliminary result)   Collection Time: 02/25/2020 11:42 PM   Specimen: BLOOD  Result Value Ref Range Status   Specimen Description BLOOD LEFT ANTECUBITAL  Final   Special Requests   Final    BOTTLES DRAWN AEROBIC AND ANAEROBIC Blood  Culture adequate volume   Culture   Final    NO GROWTH 2 DAYS Performed at [facility], [address], [place], [place] [address]    Report Status PENDING  Incomplete  Blood Culture (routine x 2)     Status: None (Preliminary result)   Collection Time: 03/01/2020 11:42 PM   Specimen: BLOOD RIGHT HAND  Result Value Ref Range Status   Specimen Description BLOOD RIGHT HAND  Final   Special Requests   Final    BOTTLES DRAWN AEROBIC AND ANAEROBIC Blood Culture adequate volume   Culture   Final    NO GROWTH 2 DAYS Performed at [facility], [address], [place], [place] [address]    Report Status PENDING  Incomplete  MRSA PCR Screening     Status: None   Collection Time: 03/14/20 12:48 PM   Specimen: Nasal Mucosa; Nasopharyngeal  Result Value Ref Range Status   MRSA by PCR NEGATIVE NEGATIVE Final    Comment:        The GeneXpert MRSA Assay (FDA approved for NASAL specimens only), is one component of a comprehensive MRSA colonization surveillance program. It is not intended to diagnose MRSA infection nor to guide or monitor treatment for MRSA infections. Performed at [facility], [address], [place], [place] [address]      Radiology Studies: CT Head Wo Contrast  Result Date: 03/15/2020 CLINICAL DATA:  Mental status changes EXAM: CT HEAD WITHOUT CONTRAST TECHNIQUE: Contiguous axial images were obtained from the base of the skull through the vertex without intravenous contrast. COMPARISON:  None. FINDINGS: Brain: There is atrophy and chronic small vessel disease changes. No acute intracranial abnormality. Specifically, no hemorrhage, hydrocephalus, mass lesion, acute infarction, or significant intracranial injury. Vascular: No hyperdense vessel or unexpected calcification. Skull: No acute calvarial abnormality. Sinuses/Orbits: No acute findings Other: None IMPRESSION: Atrophy, chronic microvascular disease. No acute intracranial abnormality. Electronically Signed   By: Rolm Baptise M.D.   On: 02/24/2020 22:23   DG Chest Portable 1 View  Result Date: 03/19/2020 CLINICAL DATA:  87 year old male with shortness of  breath. EXAM: PORTABLE CHEST 1 VIEW COMPARISON:  Chest radiograph dated 11/02/2010. FINDINGS: There is mild eventration of the left hemidiaphragm. Mid to lower lung field interstitial and hazy airspace density may represent edema or pneumonia or combination. Clinical correlation is recommended. No large pleural scars and a small left pleural effusion may be present. No pneumothorax. Stable cardiac silhouette. No acute osseous pathology. IMPRESSION: Mid to lower lung field interstitial and hazy airspace density may represent edema or pneumonia or combination. Electronically Signed   By: Anner Crete M.D.   On: 03/17/2020 22:16    Scheduled Meds: . albuterol  2 puff Inhalation Q6H  . vitamin C  500 mg Oral Daily  . baricitinib  1 mg Oral Daily  . Chlorhexidine Gluconate Cloth  6 each Topical Daily  . dexamethasone (DECADRON) injection  6 mg Intravenous Q24H  . enoxaparin (LOVENOX) injection  30 mg Subcutaneous Q24H  . zinc sulfate  220 mg Oral Daily   Continuous Infusions: . sodium chloride 50 mL/hr at 03/15/20 1403  . remdesivir 100 mg in NS 100 mL Stopped (03/15/20 0842)    LOS: 1 day   Roxan Hockey, MD How to contact the [facility] Attending or Consulting provider [place] or covering provider during after hours [place], for this patient?  1. Check the care team in [facility] and look for a) attending/consulting TRH provider listed and b) the [facility] team listed 2. Log into [email or web address] and use [place]'s universal password to access. If you do not have the password, please contact the hospital operator. 3. Locate the [facility] provider you are looking for under [facility] and page to a number that you can be directly reached. 4. If you still have difficulty reaching the provider, please page the [facility] (Director on Call) for the Hospitalists listed on amion for  assistance.  03/15/2020, 3:00 PM

## 2020-03-15 NOTE — Plan of Care (Signed)
  Problem: Acute Rehab PT Goals(only PT should resolve) Goal: Pt Will Go Supine/Side To Sit Outcome: Progressing Flowsheets (Taken 03/15/2020 1401) Pt will go Supine/Side to Sit:  with minimal assist  with min guard assist Goal: Patient Will Transfer Sit To/From Stand Outcome: Progressing Flowsheets (Taken 03/15/2020 1401) Patient will transfer sit to/from stand:  with min guard assist  with minimal assist Goal: Pt Will Transfer Bed To Chair/Chair To Bed Outcome: Progressing Flowsheets (Taken 03/15/2020 1401) Pt will Transfer Bed to Chair/Chair to Bed: with min assist Goal: Pt Will Ambulate Outcome: Progressing Flowsheets (Taken 03/15/2020 1401) Pt will Ambulate:  50 feet  with minimal assist  with rolling walker   2:02 PM, 03/15/20 Lonell Grandchild, MPT Physical Therapist with [facility] 336 [phone] office [phone] mobile phone

## 2020-03-16 DIAGNOSIS — J1282 Pneumonia due to coronavirus disease 2019: Secondary | ICD-10-CM | POA: Diagnosis not present

## 2020-03-16 DIAGNOSIS — U071 COVID-19: Secondary | ICD-10-CM | POA: Diagnosis not present

## 2020-03-16 LAB — CBC WITH DIFFERENTIAL/PLATELET
Abs Immature Granulocytes: 0.04 10*3/uL (ref 0.00–0.07)
Basophils Absolute: 0 10*3/uL (ref 0.0–0.1)
Basophils Relative: 0 %
Eosinophils Absolute: 0 10*3/uL (ref 0.0–0.5)
Eosinophils Relative: 0 %
HCT: 44 % (ref 39.0–52.0)
Hemoglobin: 13.4 g/dL (ref 13.0–17.0)
Immature Granulocytes: 1 %
Lymphocytes Relative: 6 %
Lymphs Abs: 0.4 10*3/uL — ABNORMAL LOW (ref 0.7–4.0)
MCH: 28.2 pg (ref 26.0–34.0)
MCHC: 30.5 g/dL (ref 30.0–36.0)
MCV: 92.6 fL (ref 80.0–100.0)
Monocytes Absolute: 0.2 10*3/uL (ref 0.1–1.0)
Monocytes Relative: 3 %
Neutro Abs: 5.6 10*3/uL (ref 1.7–7.7)
Neutrophils Relative %: 90 %
Platelets: 250 10*3/uL (ref 150–400)
RBC: 4.75 MIL/uL (ref 4.22–5.81)
RDW: 14.8 % (ref 11.5–15.5)
WBC: 6.2 10*3/uL (ref 4.0–10.5)
nRBC: 0 % (ref 0.0–0.2)

## 2020-03-16 LAB — COMPREHENSIVE METABOLIC PANEL
ALT: 47 U/L — ABNORMAL HIGH (ref 0–44)
AST: 57 U/L — ABNORMAL HIGH (ref 15–41)
Albumin: 2.6 g/dL — ABNORMAL LOW (ref 3.5–5.0)
Alkaline Phosphatase: 70 U/L (ref 38–126)
Anion gap: 9 (ref 5–15)
BUN: 64 mg/dL — ABNORMAL HIGH (ref 8–23)
CO2: 20 mmol/L — ABNORMAL LOW (ref 22–32)
Calcium: 8.4 mg/dL — ABNORMAL LOW (ref 8.9–10.3)
Chloride: 113 mmol/L — ABNORMAL HIGH (ref 98–111)
Creatinine, Ser: 1.96 mg/dL — ABNORMAL HIGH (ref 0.61–1.24)
GFR, Estimated: 32 mL/min — ABNORMAL LOW (ref >60)
Glucose, Bld: 136 mg/dL — ABNORMAL HIGH (ref 70–99)
Potassium: 3.9 mmol/L (ref 3.5–5.1)
Sodium: 142 mmol/L (ref 135–145)
Total Bilirubin: 0.5 mg/dL (ref 0.3–1.2)
Total Protein: 6.1 g/dL — ABNORMAL LOW (ref 6.5–8.1)

## 2020-03-16 LAB — BLOOD GAS, ARTERIAL
Acid-base deficit: 5.5 mmol/L — ABNORMAL HIGH (ref 0.0–2.0)
Bicarbonate: 19.9 mmol/L — ABNORMAL LOW (ref 20.0–28.0)
Drawn by: 27733
FIO2: 40
O2 Saturation: 96.6 %
Patient temperature: 36.7
pCO2 arterial: 37.2 mmHg (ref 32.0–48.0)
pH, Arterial: 7.334 — ABNORMAL LOW (ref 7.350–7.450)
pO2, Arterial: 98.8 mmHg (ref 83.0–108.0)

## 2020-03-16 LAB — FERRITIN: Ferritin: 882 ng/mL — ABNORMAL HIGH (ref 24–336)

## 2020-03-16 LAB — URINE CULTURE: Culture: >=100000 — AB

## 2020-03-16 LAB — D-DIMER, QUANTITATIVE: D-Dimer, Quant: 2.12 ug/mL-FEU — ABNORMAL HIGH (ref 0.00–0.50)

## 2020-03-16 LAB — C-REACTIVE PROTEIN: CRP: 6.9 mg/dL — ABNORMAL HIGH (ref <1.0)

## 2020-03-16 MED ORDER — LORAZEPAM 2 MG/ML IJ SOLN
0.5000 mg | Freq: Two times a day (BID) | INTRAMUSCULAR | Status: DC | PRN
  Administered 2020-03-17 – 2020-03-23 (×8): 0.5 mg via INTRAVENOUS
  Filled 2020-03-16 (×8): qty 1

## 2020-03-16 MED ORDER — ALBUTEROL SULFATE HFA 108 (90 BASE) MCG/ACT IN AERS
2.0000 | INHALATION_SPRAY | Freq: Three times a day (TID) | RESPIRATORY_TRACT | Status: DC
  Administered 2020-03-16 (×2): 2 via RESPIRATORY_TRACT

## 2020-03-16 MED ORDER — HALOPERIDOL LACTATE 5 MG/ML IJ SOLN
5.0000 mg | Freq: Four times a day (QID) | INTRAMUSCULAR | Status: DC | PRN
  Administered 2020-03-16 – 2020-03-20 (×6): 5 mg via INTRAMUSCULAR
  Filled 2020-03-16 (×8): qty 1

## 2020-03-16 MED ORDER — LABETALOL HCL 5 MG/ML IV SOLN: 10.0000 mg | INTRAVENOUS | Status: DC | PRN

## 2020-03-16 NOTE — Progress Notes (Signed)
Patient has became increasingly confused as this shift has progressed. He has attempted to get out of bed independently and is becoming more difficult to assist as he is refusing staff help. Patient was assisted to the [facility] and urinal provided. He proceeded to stand and pee on the floor. Patient states he is dizzy and does not want anything. He keeps yelling out for Tim. MD notified

## 2020-03-16 NOTE — Progress Notes (Signed)
Patient becoming more agitated. Ripping off his oxygen, and his telemetry leads. He is refusing to let staff place again. After 5 min this RN was able to place o2 back on patient via Nasal Cannula. Patient sats down to 72% during this episode. MD notified

## 2020-03-16 NOTE — Progress Notes (Signed)
Assisted tele visit to patient with daughter.  Thomas, Carmine Youngberg Renee, RN   

## 2020-03-16 NOTE — TOC Progression Note (Addendum)
Transition of Care [facility]) - Progression Note    Patient Details  Name: ROMARI GASPARRO MRN: [id number] Date of Birth: [birth year]  Transition of [facility] CM/SW Contact  Boneta Lucks, RN Phone Number: 03/16/2020, 11:39 AM  Clinical Narrative:   TOC spoke to Levada Dy - daughter she is requesting to send patient out for SNF bed offer. Patient will need COVID bed, we discussed SNF options. Patient is currently on 5L and confused. TOC to discuss bed offer with Levada Dy. Facility will need  Ship broker. Patient is vaccinated.    Expected Discharge Plan: [place] Barriers to Discharge: Continued Medical Work up  Expected Discharge Plan and Services Expected Discharge Plan: [place] In-house Referral: Clinical Social Work   Post Acute Care Choice: [place] arrangements for the past 2 months: Single Family Home                 DME Arranged: N/A    HH Arranged: PT,RN,Social Work [place]: [place] Date [place]: 03/15/20 Time [place]: 1257 Representative spoke with at [place]: [place]   Readmission Risk Interventions Readmission Risk Prevention Plan 03/16/2020  Medication Screening Complete  Transportation Screening Complete  Some recent data might be hidden

## 2020-03-16 NOTE — NC FL2 (Signed)
[place] MEDICAID FL2 LEVEL OF CARE SCREENING TOOL     IDENTIFICATION  Patient Name: Scott Harvey Birthdate: [birth year] Sex: male Admission Date (Current Location): 03/02/2020  [facility] and [place] Number:  [employer] and Address:  [place] [address] [place]      Provider Number: [id number]  Attending Physician Name and Address:  Roxan Hockey, MD  Relative Name and Phone Number:       Current Level of Care: Hospital Recommended Level of Care: [place] Prior Approval Number:    Date Approved/Denied:   PASRR Number: [id number]  Discharge Plan: SNF    Current Diagnoses: Patient Active Problem List   Diagnosis Date Noted  . Acute respiratory failure with hypoxia ([place]) 03/14/2020  . Pneumonia due to COVID-19 virus 03/20/2020  . Class 2 obesity 03/19/2020  . Acute renal failure superimposed on stage 3b chronic kidney disease ([place]) 03/20/2020  . Proteinuria 03/11/2020  . Hypoalbuminemia 02/29/2020  . Shingles 03/11/2019    Orientation RESPIRATION BLADDER Height & Weight     Self,Time,Place  O2 ([place] 5 at present) External catheter Weight: 107.8 kg Height:  5\' 9"  (175.3 cm)  BEHAVIORAL SYMPTOMS/MOOD NEUROLOGICAL BOWEL NUTRITION STATUS      Incontinent Diet (See Dc summary)  AMBULATORY STATUS COMMUNICATION OF NEEDS Skin   Extensive Assist Verbally Normal                       Personal Care Assistance Level of Assistance  Bathing,Feeding,Dressing Bathing Assistance: Maximum assistance Feeding assistance: Limited assistance Dressing Assistance: Maximum assistance     Functional Limitations Info  Sight,Hearing,Speech Sight Info: Impaired Hearing Info: Adequate Speech Info: Adequate    SPECIAL CARE FACTORS FREQUENCY  PT (By licensed PT)     PT Frequency: 5 times a week              Contractures Contractures Info: Present    Additional Factors Info  Code Status,Allergies Code  Status Info: Full Allergies Info: NKDA           Current Medications (03/16/2020):  This is the current hospital active medication list Current Facility-Administered Medications  Medication Dose Route Frequency Provider Last Rate Last Admin  . 0.9 %  sodium chloride infusion   Intravenous Continuous Roxan Hockey, MD 20 mL/hr at 03/15/20 1908 Infusion Verify at 03/15/20 1908  . acetaminophen (TYLENOL) tablet 650 mg  650 mg Oral Q6H PRN Reubin Milan, MD       Or  . acetaminophen (TYLENOL) suppository 650 mg  650 mg Rectal Q6H PRN Reubin Milan, MD      . albuterol (VENTOLIN HFA) 108 (90 Base) MCG/ACT inhaler 2 puff  2 puff Inhalation TID Roxan Hockey, MD   2 puff at 03/16/20 0834  . ascorbic acid (VITAMIN C) tablet 500 mg  500 mg Oral Daily Reubin Milan, MD   500 mg at 03/16/20 2585  . baricitinib (OLUMIANT) tablet 1 mg  1 mg Oral Daily Johnson, Clanford L, MD   1 mg at 03/16/20 0943  . Chlorhexidine Gluconate Cloth 2 % PADS 6 each  6 each Topical Daily Murlean Iba, MD   6 each at 03/16/20 0951  . chlorpheniramine-HYDROcodone (TUSSIONEX) 10-8 MG/5ML suspension 5 mL  5 mL Oral Q12H PRN Reubin Milan, MD   5 mL at 03/16/20 0943  . dexamethasone (DECADRON) injection 6 mg  6 mg Intravenous Q24H Reubin Milan, MD  6 mg at 03/15/20 2205  . enoxaparin (LOVENOX) injection 30 mg  30 mg Subcutaneous Q24H Reubin Milan, MD   30 mg at 03/16/20 [phone]  . guaiFENesin-dextromethorphan (ROBITUSSIN DM) 100-10 MG/5ML syrup 10 mL  10 mL Oral Q4H PRN Reubin Milan, MD      . prochlorperazine (COMPAZINE) injection 5 mg  5 mg Intravenous Q4H PRN Reubin Milan, MD      . remdesivir 100 mg in sodium chloride 0.9 % 100 mL IVPB  100 mg Intravenous Daily Reubin Milan, MD 200 mL/hr at 03/16/20 0943 100 mg at 03/16/20 0943  . zinc sulfate capsule 220 mg  220 mg Oral Daily Reubin Milan, MD   220 mg at 03/16/20 6168     Discharge  Medications: Please see discharge summary for a list of discharge medications.  Relevant Imaging Results:  Relevant Lab Results:   Additional Information SS# [id number]  Boneta Lucks, RN

## 2020-03-16 NOTE — Progress Notes (Signed)
PROGRESS NOTE   Scott Harvey  GEX:[id number] DOB: 1928-03-09 DOA: 03/02/2020 PCP: Asencion Noble, MD   Chief Complaint  Patient presents with  . Fall   Level of care: Telemetry  Brief Admission History:  87 y.o. male with medical history significant of osteoarthritis, bladder cancer, stage III CKD, dry skin, history of GI bleed due to aspirin use, history of renal cell carcinoma with left nephrectomy, impaired hearing, macular degeneration of both eyes, right renal cyst, history of herpes zoster who is coming to the emergency department via EMS after family last saw him on Saturday, but were concerned today since the patient had not been answering the phone and was subsequently found altered on the bathroom floor.  EMS described his oxygen saturation was 84% on room air and placed on supplemental oxygen.    Assessment & Plan:   Principal Problem:   Pneumonia due to COVID-19 virus Active Problems:   Class 2 obesity   Acute renal failure superimposed on stage 3b chronic kidney disease (HCC)   Proteinuria   Hypoalbuminemia   Acute respiratory failure with hypoxia (HCC)    1)Acute respiratory failure with hypoxia secondary to Covid pneumonia - Continue bronchodilators and supportive measures.  -Continue baricitinib,IV remdesivir and IV Decadron,  zinc and vitamin C   03/16/20 -Overnight patient became more confused restless and disoriented with increasing oxygen requirements, was titrated up to about 5 L of oxygen via nasal cannula -This a.m. oxygen was weaned down, currently requiring only 3 L of oxygen via nasal cannula (PTA patient was not on home O2)  COVID-19 Labs  Recent Labs    03/09/2020 2114 02/23/2020 2259 03/14/20 0729 03/15/20 0447 03/16/20 0502  DDIMER 2.98*  --  2.90* 2.33* 2.12*  FERRITIN  --    < > 827* 885* 882*  LDH 338*  --   --   --   --   CRP  --    < > 17.5* 12.4* 6.9*   < > = values in this interval not displayed.    Lab Results  Component Value Date    [place] (A) 03/02/2020   [place] Not Detected 12/23/2018     2)AKI on CKD stage 3b -creatinine is down to 1.96 from a peak of 2.19, continue to maintain adequate hydration  3)BPH--Restart Flomax  4)Generalized weakness and deconditioning--- PT recommends SNF rehab patient and family are Now more willing to consider SNF rehab  -Social Worker looking into SNF placement  DVT prophylaxis: enoxaparin Code Status: full  Family Communication: Discussed with daughter Levada Dy on 03/15/2020 Disposition: TBD Status is: Inpatient  Remains inpatient appropriate because:IV treatments appropriate due to intensity of illness or inability to take PO and Inpatient level of care appropriate due to severity of illness --Worsening hypoxia, confusion/metabolic encephalopathy  Dispo: The patient is from: Home              Anticipated d/c is to: SNF                Anticipated d/c date is: 1 day              Patient currently is medically stable to d/c.   Difficult to place patient No --   Consultants:     Procedures:     Antimicrobials:     Subjective:  03/16/20 -Overnight patient became more confused restless and disoriented with increasing oxygen requirements, was titrated up to about 5 L of oxygen via nasal cannula -This a.m. oxygen was weaned  down, currently requiring only 3 L of oxygen via nasal cannula (PTA patient was not on home O2)  Objective: Vitals:   03/16/20 0500 03/16/20 0810 03/16/20 1000 03/16/20 1100  BP: (!) 152/90 (!) 177/97    Pulse: 79     Resp: (!) 24 20    Temp:    97.7 F (36.5 C)  TempSrc:    Oral  SpO2: 96%  93%   Weight:      Height:        Intake/Output Summary (Last 24 hours) at 03/16/2020 1241 Last data filed at 03/16/2020 0800 Gross per 24 hour  Intake 478.22 ml  Output 725 ml  Net -246.78 ml   Filed Weights   02/28/2020 2102 03/14/20 1243 03/16/20 0330  Weight: 116.1 kg 107.1 kg 107.8 kg    Examination:  General exam: frail  elderly male, awake, no acute distress Nose- [place] 3 L/min Respiratory system: bibasilar rales.  Fair air movement Cardiovascular system: normal S1 & S2 heard. No JVD, murmurs, rubs,  Gastrointestinal system: Abdomen is nondistended, soft and nontender.  . Normal bowel sounds heard. Central nervous system: Alert and oriented. No focal neurological deficits. Extremities: Symmetric 5 x 5 power. Skin: No rashes, lesions or ulcers Psychiatry: Judgement and insight appear normal. Mood & affect appropriate.   Data Reviewed: I have personally reviewed following labs and imaging studies  CBC: Recent Labs  Lab 03/15/2020 2114 03/14/20 0729 03/15/20 0447 03/16/20 0502  WBC 7.5 5.6 5.4 6.2  NEUTROABS 6.6 5.0 4.8 5.6  HGB 13.5 13.1 12.4* 13.4  HCT 44.5 44.0 41.3 44.0  MCV 93.1 94.2 93.0 92.6  PLT 170 167 189 497    Basic Metabolic Panel: Recent Labs  Lab 03/05/2020 2114 03/14/20 0729 03/15/20 0447 03/16/20 0502  NA 142 144 143 142  K 4.2 4.5 3.9 3.9  CL 109 111 113* 113*  CO2 21* 21* 20* 20*  GLUCOSE 92 109* 129* 136*  BUN 43* 46* 63* 64*  CREATININE 2.19* 2.13* 2.19* 1.96*  CALCIUM 8.5* 8.5* 8.2* 8.4*  MG  --   --  2.0  --   PHOS  --   --  4.2  --     GFR: Estimated Creatinine Clearance: 30.9 mL/min (A) (by C-G formula based on SCr of 1.96 mg/dL (H)).  Liver Function Tests: Recent Labs  Lab 03/17/2020 2114 03/14/20 0729 03/15/20 0447 03/16/20 0502  AST 94* 85* 66* 57*  ALT 59* 53* 47* 47*  ALKPHOS 82 73 65 70  BILITOT 1.1 0.6 0.5 0.5  PROT 6.8 6.4* 5.7* 6.1*  ALBUMIN 3.0* 2.7* 2.4* 2.6*    CBG: No results for input(s): GLUCAP in the last 168 hours.  Recent Results (from the past 240 hour(s))  Urine culture     Status: Abnormal   Collection Time: 03/15/2020  9:14 PM   Specimen: Urine, Clean Catch  Result Value Ref Range Status   Specimen Description   Final    URINE, CLEAN CATCH Performed at [facility], [address] [place], [place] [address]    Special  Requests   Final    NONE Performed at [facility], [address], [place], [place] [address]    Culture (A)  Final    >=100,000 COLONIES/mL GROUP B STREP(S.AGALACTIAE)ISOLATED TESTING AGAINST S. AGALACTIAE NOT ROUTINELY PERFORMED DUE TO PREDICTABILITY OF AMP/PEN/VAN SUSCEPTIBILITY. Performed at [facility], [place] [address] [place], [place] [address]    Report Status 03/16/2020 FINAL  Final  SARS Coronavirus  2 by RT PCR (hospital order, performed in [facility] hospital lab) Nasopharyngeal Nasopharyngeal Swab     Status: Abnormal   Collection Time: 03/09/2020  9:15 PM   Specimen: Nasopharyngeal Swab  Result Value Ref Range Status   SARS Coronavirus 2 POSITIVE (A) NEGATIVE Final    Comment: RESULT CALLED TO, READ BACK BY AND VERIFIED WITH: R THOMPSON,RN@2304  03/16/2020 MKELLY (NOTE) SARS-CoV-2 target nucleic acids are DETECTED  SARS-CoV-2 RNA is generally detectable in upper respiratory specimens  during the acute phase of infection.  Positive results are indicative  of the presence of the identified virus, but do not rule out bacterial infection or co-infection with other pathogens not detected by the test.  Clinical correlation with patient history and  other diagnostic information is necessary to determine patient infection status.  The expected result is negative.  Fact Sheet for Patients:   StrictlyIdeas.no   Fact Sheet for Healthcare Providers:   BankingDealers.co.za    This test is not yet approved or cleared by the [place] FDA and  has been authorized for detection and/or diagnosis of SARS-CoV-2 by FDA under an Emergency Use Authorization (EUA).  This EUA will remain in effect (meaning this te st can be used) for the duration of  the COVID-19 declaration under Section 564(b)(1) of the Act, 21 U.S.C. section 360-bbb-3(b)(1), unless the authorization is terminated or revoked sooner.  Performed at [facility], [address], [place], [place] [address]   Blood Culture (routine x 2)     Status: None (Preliminary result)   Collection Time: 02/24/2020 11:42 PM   Specimen: BLOOD  Result Value Ref Range Status   Specimen Description BLOOD LEFT ANTECUBITAL  Final   Special Requests   Final    BOTTLES DRAWN AEROBIC AND ANAEROBIC Blood Culture adequate volume   Culture   Final    NO GROWTH 3 DAYS Performed at [facility], [address] [place], [place] [address]    Report Status PENDING  Incomplete  Blood Culture (routine x 2)     Status: None (Preliminary result)   Collection Time: 03/12/2020 11:42 PM   Specimen: BLOOD RIGHT HAND  Result Value Ref Range Status   Specimen Description BLOOD RIGHT HAND  Final   Special Requests   Final    BOTTLES DRAWN AEROBIC AND ANAEROBIC Blood Culture adequate volume   Culture   Final    NO GROWTH 3 DAYS Performed at [facility], [address], [place], [place] [address]    Report Status PENDING  Incomplete  MRSA PCR Screening     Status: None   Collection Time: 03/14/20 12:48 PM   Specimen: Nasal Mucosa; Nasopharyngeal  Result Value Ref Range Status   MRSA by PCR NEGATIVE NEGATIVE Final    Comment:        The GeneXpert MRSA Assay (FDA approved for NASAL specimens only), is one component of a comprehensive MRSA colonization surveillance program. It is not intended to diagnose MRSA infection nor to guide or monitor treatment for MRSA infections. Performed at [facility], [address], [place],  [address]      Radiology Studies: No results found.  Scheduled Meds: . albuterol  2 puff Inhalation TID  . vitamin C  500 mg Oral Daily  . baricitinib  1 mg Oral Daily  . Chlorhexidine Gluconate Cloth  6 each Topical Daily  . dexamethasone (DECADRON) injection  6 mg Intravenous Q24H  . enoxaparin (LOVENOX) injection  30 mg Subcutaneous Q24H  .  zinc sulfate  220 mg Oral Daily   Continuous Infusions: . sodium chloride 20 mL/hr at 03/15/20  1908  . remdesivir 100 mg in NS 100 mL 100 mg (03/16/20 0943)    LOS: 2 days   Roxan Hockey, MD How to contact the [facility] Attending or Consulting provider [place] or covering provider during after hours [place], for this patient?  1. Check the care team in [facility] and look for a) attending/consulting TRH provider listed and b) the [facility] team listed 2. Log into [email or web address] and use [facility]'s universal password to access. If you do not have the password, please contact the hospital operator. 3. Locate the [facility] provider you are looking for under [facility] and page to a number that you can be directly reached. 4. If you still have difficulty reaching the provider, please page the [facility] (Director on Call) for the Hospitalists listed on amion for assistance.  03/16/2020, 12:41 PM

## 2020-03-16 NOTE — Progress Notes (Signed)
Assumed care of patient at 0700. Patient seen this morning, assisted with 1 person Mod assist to recliner chair. Bed alarm in place. Patient states, " I have blurry vision because of the shingles still." patient a/o x 4, but is very hard of hearing and delayed in his responses at times. He stated to this RN that he was at home, went to church, believes he got "the virus" (COVID) from church. He then stated later at home, he was not feeling well, and his house was very dark and he could not find his way back to his bed or to his recliner. Patient able to use urinal with assistance.

## 2020-03-17 DIAGNOSIS — U071 COVID-19: Secondary | ICD-10-CM | POA: Diagnosis not present

## 2020-03-17 DIAGNOSIS — J1282 Pneumonia due to coronavirus disease 2019: Secondary | ICD-10-CM | POA: Diagnosis not present

## 2020-03-17 LAB — COMPREHENSIVE METABOLIC PANEL
ALT: 56 U/L — ABNORMAL HIGH (ref 0–44)
AST: 68 U/L — ABNORMAL HIGH (ref 15–41)
Albumin: 2.5 g/dL — ABNORMAL LOW (ref 3.5–5.0)
Alkaline Phosphatase: 65 U/L (ref 38–126)
Anion gap: 5 (ref 5–15)
BUN: 62 mg/dL — ABNORMAL HIGH (ref 8–23)
CO2: 22 mmol/L (ref 22–32)
Calcium: 8.6 mg/dL — ABNORMAL LOW (ref 8.9–10.3)
Chloride: 116 mmol/L — ABNORMAL HIGH (ref 98–111)
Creatinine, Ser: 1.81 mg/dL — ABNORMAL HIGH (ref 0.61–1.24)
GFR, Estimated: 35 mL/min — ABNORMAL LOW (ref >60)
Glucose, Bld: 125 mg/dL — ABNORMAL HIGH (ref 70–99)
Potassium: 4.5 mmol/L (ref 3.5–5.1)
Sodium: 143 mmol/L (ref 135–145)
Total Bilirubin: 0.6 mg/dL (ref 0.3–1.2)
Total Protein: 5.8 g/dL — ABNORMAL LOW (ref 6.5–8.1)

## 2020-03-17 LAB — CBC WITH DIFFERENTIAL/PLATELET
Abs Immature Granulocytes: 0.07 10*3/uL (ref 0.00–0.07)
Basophils Absolute: 0 10*3/uL (ref 0.0–0.1)
Basophils Relative: 0 %
Eosinophils Absolute: 0 10*3/uL (ref 0.0–0.5)
Eosinophils Relative: 0 %
HCT: 43.2 % (ref 39.0–52.0)
Hemoglobin: 12.8 g/dL — ABNORMAL LOW (ref 13.0–17.0)
Immature Granulocytes: 1 %
Lymphocytes Relative: 6 %
Lymphs Abs: 0.4 10*3/uL — ABNORMAL LOW (ref 0.7–4.0)
MCH: 27.9 pg (ref 26.0–34.0)
MCHC: 29.6 g/dL — ABNORMAL LOW (ref 30.0–36.0)
MCV: 94.1 fL (ref 80.0–100.0)
Monocytes Absolute: 0.2 10*3/uL (ref 0.1–1.0)
Monocytes Relative: 4 %
Neutro Abs: 5.5 10*3/uL (ref 1.7–7.7)
Neutrophils Relative %: 89 %
Platelets: 248 10*3/uL (ref 150–400)
RBC: 4.59 MIL/uL (ref 4.22–5.81)
RDW: 14.8 % (ref 11.5–15.5)
WBC: 6.2 10*3/uL (ref 4.0–10.5)
nRBC: 0 % (ref 0.0–0.2)

## 2020-03-17 LAB — D-DIMER, QUANTITATIVE: D-Dimer, Quant: 1.85 ug/mL-FEU — ABNORMAL HIGH (ref 0.00–0.50)

## 2020-03-17 LAB — FERRITIN: Ferritin: 736 ng/mL — ABNORMAL HIGH (ref 24–336)

## 2020-03-17 LAB — C-REACTIVE PROTEIN: CRP: 3.7 mg/dL — ABNORMAL HIGH (ref <1.0)

## 2020-03-17 MED ORDER — BARICITINIB 2 MG PO TABS
2.0000 mg | ORAL_TABLET | Freq: Every day | ORAL | Status: DC
  Administered 2020-03-17 – 2020-03-19 (×3): 2 mg via ORAL
  Filled 2020-03-17 (×4): qty 1

## 2020-03-17 MED ORDER — ENOXAPARIN SODIUM 40 MG/0.4ML ~~LOC~~ SOLN
40.0000 mg | SUBCUTANEOUS | Status: DC
  Administered 2020-03-17 – 2020-03-20 (×4): 40 mg via SUBCUTANEOUS
  Filled 2020-03-17 (×4): qty 0.4

## 2020-03-17 MED ORDER — PREDNISONE 20 MG PO TABS
50.0000 mg | ORAL_TABLET | Freq: Every day | ORAL | Status: DC
  Administered 2020-03-18 – 2020-03-19 (×2): 50 mg via ORAL
  Filled 2020-03-17 (×3): qty 1

## 2020-03-17 MED ORDER — ALBUTEROL SULFATE HFA 108 (90 BASE) MCG/ACT IN AERS
2.0000 | INHALATION_SPRAY | Freq: Four times a day (QID) | RESPIRATORY_TRACT | Status: DC | PRN
  Administered 2020-03-18: 2 via RESPIRATORY_TRACT

## 2020-03-17 NOTE — TOC Progression Note (Signed)
Transition of Care [facility]) - Progression Note    Patient Details  Name: Scott Harvey MRN: [id number] Date of Birth: 1928-05-28  [facility] CM/SW Contact  Demont Linford, Chauncey [place], RN Phone Number: 03/17/2020, 11:17 AM  Clinical Narrative:   Patient has bed at [facility]. Now on 3 liters per MD. Not ready to DC today. [facility] updated.     Expected Discharge Plan: [place] Barriers to Discharge: Continued Medical Work up  Expected Discharge Plan and Services Expected Discharge Plan: [place] In-house Referral: Clinical Social Work   Post Acute Care Choice: [place] arrangements for the past 2 months: Single Family Home                 DME Arranged: N/A         HH Arranged: PT,RN,Social Work [place]: [place] Date [place]: 03/15/20 Time [place]: 1257 Representative spoke with at [place]: [place] ([place]) Interventions    Readmission Risk Interventions Readmission Risk Prevention Plan 03/16/2020  Medication Screening Complete  Transportation Screening Complete  Some recent data might be hidden

## 2020-03-17 NOTE — Care Management Important Message (Signed)
Important Message  Patient Details  Name: Scott Harvey MRN: [id number] Date of Birth: [birth year]   Medicare Important Message Given:  Yes     Tommy Medal 03/17/2020, 12:35 PM

## 2020-03-17 NOTE — Progress Notes (Signed)
PROGRESS NOTE   Scott Harvey  FTD:[id number] DOB: September 02, 1928 DOA: 02/24/2020 PCP: Asencion Noble, MD   Chief Complaint  Patient presents with  . Fall   Level of care: Telemetry  Brief Admission History:  87 y.o. male with medical history significant of osteoarthritis, bladder cancer, stage III CKD, dry skin, history of GI bleed due to aspirin use, history of renal cell carcinoma with left nephrectomy, impaired hearing, macular degeneration of both eyes, right renal cyst, history of herpes zoster who is coming to the emergency department via EMS after family last saw him on Saturday, but were concerned today since the patient had not been answering the phone and was subsequently found altered on the bathroom floor.  EMS described his oxygen saturation was 84% on room air and placed on supplemental oxygen.    Assessment & Plan:   Principal Problem:   Pneumonia due to COVID-19 virus Active Problems:   Class 2 obesity   Acute renal failure superimposed on stage 3b chronic kidney disease (HCC)   Proteinuria   Hypoalbuminemia   Acute respiratory failure with hypoxia (HCC)    1)Acute respiratory failure with hypoxia secondary to Covid pneumonia - Continue bronchodilators and supportive measures.  -Continue baricitinib,IV remdesivir , prednisone,  zinc and vitamin C   03/17/20 -Overnight patient became more confused restless and disoriented with increasing oxygen requirements, was titrated up to about 8 L of oxygen via nasal cannula -This a.m. 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 prednisone -May use as needed Haldol IM and as needed lorazepam IV  COVID-19 Labs  Recent Labs    03/15/20 0447 03/16/20 0502 03/17/20 0454  DDIMER 2.33* 2.12* 1.85*  FERRITIN 885* 882* 736*  CRP 12.4* 6.9* 3.7*    Lab Results  Component  Value Date   SARSCOV2NAA POSITIVE (A) 03/11/2020   [place] Not Detected 12/23/2018    2)AKI on CKD stage 3b -creatinine is down to 1.96 from a peak of 2.19, continue to maintain adequate hydration  3)BPH--c/n  Flomax  4)Generalized weakness and deconditioning--- PT recommends SNF rehab patient and family are Now more willing to consider SNF rehab  Transfer to SNF facility when steroid-induced psychosis improved  5) steroid-induced psychosis with behavioral disturbance-----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 prednisone -May use as needed Haldol IM and as needed lorazepam IV  DVT prophylaxis: enoxaparin Code Status: full  Family Communication: Discussed with daughter Levada Dy on 03/15/2020 Disposition: TBD Status is: Inpatient  Remains inpatient appropriate because:IV treatments appropriate due to intensity of illness or inability to take PO and Inpatient level of care appropriate due to severity of illness --Worsening hypoxia, confusion/metabolic encephalopathy  Dispo: The patient is from: Home              Anticipated d/c is to: SNF                Anticipated d/c date is: 1 day              Patient currently is medically stable to d/c.   Difficult to place patient No --   Consultants:     Procedures:     Antimicrobials:     Subjective:  03/17/20 -Overnight patient became more confused restless and disoriented with increasing oxygen requirements, was titrated up to about 8 L of oxygen via  nasal cannula -This a.m. 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 prednisone -May use as needed Haldol IM and as needed lorazepam IV  Objective: Vitals:   03/17/20 0300 03/17/20 0400 03/17/20 0500 03/17/20 0800  BP:  (!) 101/59  117/61  Pulse: (!) 59 (!) 56 (!) 52 (!) 49   Resp: 15 15 14 14   Temp:  97.6 F (36.4 C)    TempSrc:  Oral    SpO2: 100% 100% 100% 98%  Weight:      Height:        Intake/Output Summary (Last 24 hours) at 03/17/2020 1458 Last data filed at 03/16/2020 2200 Gross per 24 hour  Intake 240 ml  Output -  Net 240 ml   Filed Weights   02/29/2020 2102 03/14/20 1243 03/16/20 0330  Weight: 116.1 kg 107.1 kg 107.8 kg    Examination:  General exam: frail elderly male, awake, no acute distress Nose- [place] 3 L/min Respiratory system: bibasilar rales.  Fair air movement Cardiovascular system: normal S1 & S2 heard. No JVD, murmurs, rubs,  Gastrointestinal system: Abdomen is nondistended, soft and nontender.  . Normal bowel sounds heard. Central nervous system: Generalized weakness, no new focal neurological deficits. Extremities: Symmetric 5 x 5 power. Skin: No rashes, lesions or ulcers Psychiatry: Episodes of confusion and disorientation and agitation overnight becoming more cooperative and calm at this time  Data Reviewed: I have personally reviewed following labs and imaging studies  CBC: Recent Labs  Lab 02/21/2020 2114 03/14/20 0729 03/15/20 0447 03/16/20 0502 03/17/20 0454  WBC 7.5 5.6 5.4 6.2 6.2  NEUTROABS 6.6 5.0 4.8 5.6 5.5  HGB 13.5 13.1 12.4* 13.4 12.8*  HCT 44.5 44.0 41.3 44.0 43.2  MCV 93.1 94.2 93.0 92.6 94.1  PLT 170 167 189 250 195    Basic Metabolic Panel: Recent Labs  Lab 03/02/2020 2114 03/14/20 0729 03/15/20 0447 03/16/20 0502 03/17/20 0454  NA 142 144 143 142 143  K 4.2 4.5 3.9 3.9 4.5  CL 109 111 113* 113* 116*  CO2 21* 21* 20* 20* 22  GLUCOSE 92 109* 129* 136* 125*  BUN 43* 46* 63* 64* 62*  CREATININE 2.19* 2.13* 2.19* 1.96* 1.81*  CALCIUM 8.5* 8.5* 8.2* 8.4* 8.6*  MG  --   --  2.0  --   --   PHOS  --   --  4.2  --   --     GFR: Estimated Creatinine Clearance: 33.5 mL/min (A) (by C-G formula based on SCr of 1.81 mg/dL (H)).  Liver Function Tests: Recent Labs  Lab 02/29/2020 2114  03/14/20 0729 03/15/20 0447 03/16/20 0502 03/17/20 0454  AST 94* 85* 66* 57* 68*  ALT 59* 53* 47* 47* 56*  ALKPHOS 82 73 65 70 65  BILITOT 1.1 0.6 0.5 0.5 0.6  PROT 6.8 6.4* 5.7* 6.1* 5.8*  ALBUMIN 3.0* 2.7* 2.4* 2.6* 2.5*    CBG: No results for input(s): GLUCAP in the last 168 hours.  Recent Results (from the past 240 hour(s))  Urine culture     Status: Abnormal   Collection Time: 03/14/2020  9:14 PM   Specimen: Urine, Clean Catch  Result Value Ref Range Status   Specimen Description   Final    URINE, CLEAN CATCH Performed at [facility], [address] [place], [place] [address]    Special Requests  Final    NONE Performed at [facility], [address] [place], [place] [address]    Culture (A)  Final    >=100,000 COLONIES/mL GROUP B STREP(S.AGALACTIAE)ISOLATED TESTING AGAINST S. AGALACTIAE NOT ROUTINELY PERFORMED DUE TO PREDICTABILITY OF AMP/PEN/VAN SUSCEPTIBILITY. Performed at [facility], [place] [address] [place], [place] [address]    Report Status 03/16/2020 FINAL  Final  SARS Coronavirus 2 by RT PCR (hospital order, performed in [facility] hospital lab) Nasopharyngeal Nasopharyngeal Swab     Status: Abnormal   Collection Time: 02/27/2020  9:15 PM   Specimen: Nasopharyngeal Swab  Result Value Ref Range Status   SARS Coronavirus 2 POSITIVE (A) NEGATIVE Final    Comment: RESULT CALLED TO, READ BACK BY AND VERIFIED WITH: R THOMPSON,RN@2304  03/10/2020 MKELLY (NOTE) SARS-CoV-2 target nucleic acids are DETECTED  SARS-CoV-2 RNA is generally detectable in upper respiratory specimens  during the acute phase of infection.  Positive results are indicative  of the presence of the identified virus, but do not rule out bacterial infection or co-infection with other pathogens not detected by the test.  Clinical correlation with patient history and  other diagnostic information is necessary to determine patient infection status.  The expected result is negative.  Fact  Sheet for Patients:   StrictlyIdeas.no   Fact Sheet for Healthcare Providers:   BankingDealers.co.za    This test is not yet approved or cleared by the [place] FDA and  has been authorized for detection and/or diagnosis of SARS-CoV-2 by FDA under an Emergency Use Authorization (EUA).  This EUA will remain in effect (meaning this te st can be used) for the duration of  the COVID-19 declaration under Section 564(b)(1) of the Act, 21 U.S.C. section 360-bbb-3(b)(1), unless the authorization is terminated or revoked sooner.  Performed at [facility], [address], [place], [place] [address]   Blood Culture (routine x 2)     Status: None (Preliminary result)   Collection Time: 02/21/2020 11:42 PM   Specimen: BLOOD  Result Value Ref Range Status   Specimen Description BLOOD LEFT ANTECUBITAL  Final   Special Requests   Final    BOTTLES DRAWN AEROBIC AND ANAEROBIC Blood Culture adequate volume   Culture   Final    NO GROWTH 4 DAYS Performed at [facility], [address] [place], [place] [address]    Report Status PENDING  Incomplete  Blood Culture (routine x 2)     Status: None (Preliminary result)   Collection Time: 02/21/2020 11:42 PM   Specimen: BLOOD RIGHT HAND  Result Value Ref Range Status   Specimen Description BLOOD RIGHT HAND  Final   Special Requests   Final    BOTTLES DRAWN AEROBIC AND ANAEROBIC Blood Culture adequate volume   Culture   Final    NO GROWTH 4 DAYS Performed at [facility], [address], [place],  [address]    Report Status PENDING  Incomplete  MRSA PCR Screening     Status: None   Collection Time: 03/14/20 12:48 PM   Specimen: Nasal Mucosa; Nasopharyngeal  Result Value Ref Range Status   MRSA by PCR NEGATIVE NEGATIVE Final    Comment:        The GeneXpert MRSA Assay (FDA approved for NASAL specimens only), is one component of a comprehensive MRSA colonization surveillance program. It is  not intended to diagnose MRSA infection nor to guide or monitor treatment for MRSA infections. Performed at [facility], [address] [place], [place]  [place]      Radiology Studies: No results found.  Scheduled Meds: . vitamin C  500 mg Oral Daily  . baricitinib  2 mg Oral Daily  . Chlorhexidine Gluconate Cloth  6 each Topical Daily  . enoxaparin (LOVENOX) injection  40 mg Subcutaneous Q24H  . [START ON 03/18/2020] predniSONE  50 mg Oral Q breakfast  . zinc sulfate  220 mg Oral Daily   Continuous Infusions: . sodium chloride 20 mL/hr at 03/15/20 1908    LOS: 3 days   Roxan Hockey, MD How to contact the [facility] Attending or Consulting provider [place] or covering provider during after hours [place], for this patient?  1. Check the care team in [facility] and look for a) attending/consulting TRH provider listed and b) the [facility] team listed 2. Log into [email or web address] and use [place]'s universal password to access. If you do not have the password, please contact the hospital operator. 3. Locate the [facility] provider you are looking for under [facility] and page to a number that you can be directly reached. 4. If you still have difficulty reaching the provider, please page the [facility] (Director on Call) for the Hospitalists listed on amion for assistance.  03/17/2020, 2:58 PM

## 2020-03-17 NOTE — Progress Notes (Signed)
Physical Therapy Treatment Patient Details Name: Scott Harvey MRN: [id number] DOB: 09-Sep-1928 Today's Date: 03/17/2020    History of Present Illness Scott Harvey is a 87 y.o. male with medical history significant of osteoarthritis, bladder cancer, stage III CKD, dry skin, history of GI bleed due to aspirin use, history of renal cell carcinoma with left nephrectomy, impaired hearing, macular degeneration of both eyes, right renal cyst, history of herpes zoster who is coming to the emergency department via EMS after family last saw him on Saturday, but were concerned today since the patient had not been answering the phone and was subsequently found altered on the bathroom floor.  EMS described his oxygen saturation was 84% on room air and placed in an osteochondroma oxygen.  Per triage notes, he is usually oriented x4, but when seen, the patient was oriented to name, place, partially oriented to time/date and situation.  He denied headache, sore throat, chest pain, abdominal or back pain.    PT Comments    Patient demonstrates slow labored movement for sitting up at bedside, demonstrates fair return for completing BLE ROM/strengthening exercises requiring repeated verbal cues and demonstration, limited to a few steps at bedside before having to sit due to fatigue and SOB.  Patient tolerated sitting up in chair after therapy - nursing staff notified.  Patient will benefit from continued physical therapy in hospital and recommended venue below to increase strength, balance, endurance for safe ADLs and gait.      Follow Up Recommendations  SNF;Supervision for mobility/OOB;Supervision/Assistance - 24 hour     Equipment Recommendations  Rolling walker with 5" wheels    Recommendations for Other Services       Precautions / Restrictions Precautions Precautions: Fall Restrictions Weight Bearing Restrictions: No    Mobility  Bed Mobility Overal bed mobility: Needs Assistance Bed  Mobility: Supine to Sit     Supine to sit: Mod assist     General bed mobility comments: increased time, labored movement  Transfers Overall transfer level: Needs assistance Equipment used: Rolling walker (2 wheeled) Transfers: Sit to/from [facility] Sit to Stand: Min assist;Mod assist Stand pivot transfers: Mod assist       General transfer comment: increased time, labored movement  Ambulation/Gait Ambulation/Gait assistance: Mod assist Gait Distance (Feet): 5 Feet Assistive device: Rolling walker (2 wheeled) Gait Pattern/deviations: Decreased step length - right;Decreased step length - left;Decreased stride length Gait velocity: decreased   General Gait Details: limited to 5-6 slow labored steps at bedside due to weakness and fatigue while on 2 LPM with SpO2 dropping to 85%   Stairs             Wheelchair Mobility    Modified Rankin (Stroke Patients Only)       Balance Overall balance assessment: Needs assistance Sitting-balance support: Feet supported;No upper extremity supported Sitting balance-Leahy Scale: Good Sitting balance - Comments: seated at EOB   Standing balance support: During functional activity;Bilateral upper extremity supported Standing balance-Leahy Scale: Fair Standing balance comment: fair/poor using RW                            Cognition Arousal/Alertness: Awake/alert Behavior During Therapy: WFL for tasks assessed/performed Overall Cognitive Status: History of cognitive impairments - at baseline  Exercises General Exercises - Lower Extremity Long Arc Quad: Seated;AROM;Strengthening;Both;10 reps Hip Flexion/Marching: Seated;AROM;Strengthening;Both;10 reps Toe Raises: Seated;AROM;Strengthening;Both;10 reps Heel Raises: Seated;AROM;Strengthening;Both;10 reps    General Comments        Pertinent Vitals/Pain Pain Assessment: No/denies pain     Home Living                      Prior Function            PT Goals (current goals can now be found in the care plan section) Acute Rehab PT Goals Patient Stated Goal: return home with family to assist PT Goal Formulation: With patient Time For Goal Achievement: 03/29/20 Potential to Achieve Goals: Good Progress towards PT goals: Progressing toward goals    Frequency    Min 3X/week      PT Plan Current plan remains appropriate    Co-evaluation              AM-PAC PT "6 Clicks" Mobility   Outcome Measure  Help needed turning from your back to your side while in a flat bed without using bedrails?: A Little Help needed moving from lying on your back to sitting on the side of a flat bed without using bedrails?: A Lot Help needed moving to and from a bed to a chair (including a wheelchair)?: A Lot Help needed standing up from a chair using your arms (e.g., wheelchair or bedside chair)?: A Lot Help needed to walk in hospital room?: A Lot Help needed climbing 3-5 steps with a railing? : Total 6 Click Score: 12    End of Session Equipment Utilized During Treatment: Oxygen Activity Tolerance: Patient tolerated treatment well;Patient limited by fatigue Patient left: in chair;with call bell/phone within reach;with chair alarm set Nurse Communication: Mobility status PT Visit Diagnosis: Unsteadiness on feet (R26.81);Other abnormalities of gait and mobility (R26.89);Muscle weakness (generalized) (M62.81)     Time: 3976-7341 PT Time Calculation (min) (ACUTE ONLY): 21 min  Charges:  $Therapeutic Exercise: 8-22 mins $Therapeutic Activity: 8-22 mins                     4:31 PM, 03/17/20 Lonell Grandchild, MPT Physical Therapist with [facility] [phone] office [phone] mobile phone

## 2020-03-17 NOTE — Progress Notes (Signed)
Patient very confused and non-compliant this am, taking his leads and oxygen off. Patient was placed back on [place], safe environment.   Later this afternoon, patient woke up more alert and calm. Cooperative with cares, allowed staff to assist with a bed bath. No new skin issues noted. Tried to place tubing and equipment out of sight

## 2020-03-17 NOTE — Progress Notes (Signed)
Patient up to chair for 4 hours today. Tolerated well. Very impulsive and chair alarm in place. Patient was assisted back to bed and was able help staff with stand pivot. Patient wax and waning mentation. He will be very cooperative and calm at times, and occasional still will have episodes of pulling off his telemetry leads and oxygen. Frequent visual checks remain in place. Bed alarm on. Bed in low position. Call light in reach.

## 2020-03-18 ENCOUNTER — Other Ambulatory Visit: Payer: Self-pay

## 2020-03-18 DIAGNOSIS — U071 COVID-19: Secondary | ICD-10-CM | POA: Diagnosis not present

## 2020-03-18 DIAGNOSIS — J1282 Pneumonia due to coronavirus disease 2019: Secondary | ICD-10-CM | POA: Diagnosis not present

## 2020-03-18 LAB — COMPREHENSIVE METABOLIC PANEL
ALT: 95 U/L — ABNORMAL HIGH (ref 0–44)
AST: 93 U/L — ABNORMAL HIGH (ref 15–41)
Albumin: 2.7 g/dL — ABNORMAL LOW (ref 3.5–5.0)
Alkaline Phosphatase: 73 U/L (ref 38–126)
Anion gap: 7 (ref 5–15)
BUN: 65 mg/dL — ABNORMAL HIGH (ref 8–23)
CO2: 23 mmol/L (ref 22–32)
Calcium: 8.9 mg/dL (ref 8.9–10.3)
Chloride: 116 mmol/L — ABNORMAL HIGH (ref 98–111)
Creatinine, Ser: 1.89 mg/dL — ABNORMAL HIGH (ref 0.61–1.24)
GFR, Estimated: 34 mL/min — ABNORMAL LOW (ref >60)
Glucose, Bld: 83 mg/dL (ref 70–99)
Potassium: 4.3 mmol/L (ref 3.5–5.1)
Sodium: 146 mmol/L — ABNORMAL HIGH (ref 135–145)
Total Bilirubin: 0.6 mg/dL (ref 0.3–1.2)
Total Protein: 6.4 g/dL — ABNORMAL LOW (ref 6.5–8.1)

## 2020-03-18 LAB — CULTURE, BLOOD (ROUTINE X 2)
Culture: NO GROWTH
Culture: NO GROWTH
Special Requests: ADEQUATE
Special Requests: ADEQUATE

## 2020-03-18 LAB — CBC WITH DIFFERENTIAL/PLATELET
Abs Immature Granulocytes: 0.19 10*3/uL — ABNORMAL HIGH (ref 0.00–0.07)
Basophils Absolute: 0.1 10*3/uL (ref 0.0–0.1)
Basophils Relative: 1 %
Eosinophils Absolute: 0 10*3/uL (ref 0.0–0.5)
Eosinophils Relative: 0 %
HCT: 48.7 % (ref 39.0–52.0)
Hemoglobin: 14.6 g/dL (ref 13.0–17.0)
Immature Granulocytes: 2 %
Lymphocytes Relative: 9 %
Lymphs Abs: 0.8 10*3/uL (ref 0.7–4.0)
MCH: 27.9 pg (ref 26.0–34.0)
MCHC: 30 g/dL (ref 30.0–36.0)
MCV: 93.1 fL (ref 80.0–100.0)
Monocytes Absolute: 0.6 10*3/uL (ref 0.1–1.0)
Monocytes Relative: 7 %
Neutro Abs: 7.2 10*3/uL (ref 1.7–7.7)
Neutrophils Relative %: 81 %
Platelets: 305 10*3/uL (ref 150–400)
RBC: 5.23 MIL/uL (ref 4.22–5.81)
RDW: 15 % (ref 11.5–15.5)
WBC: 8.9 10*3/uL (ref 4.0–10.5)
nRBC: 0.2 % (ref 0.0–0.2)

## 2020-03-18 LAB — C-REACTIVE PROTEIN: CRP: 2.4 mg/dL — ABNORMAL HIGH (ref <1.0)

## 2020-03-18 LAB — D-DIMER, QUANTITATIVE: D-Dimer, Quant: 2.83 ug/mL-FEU — ABNORMAL HIGH (ref 0.00–0.50)

## 2020-03-18 LAB — FERRITIN: Ferritin: 578 ng/mL — ABNORMAL HIGH (ref 24–336)

## 2020-03-18 MED ORDER — SODIUM BICARBONATE 650 MG PO TABS
650.0000 mg | ORAL_TABLET | Freq: Two times a day (BID) | ORAL | Status: DC
  Administered 2020-03-18 – 2020-03-19 (×2): 650 mg via ORAL
  Filled 2020-03-18 (×3): qty 1

## 2020-03-18 MED ORDER — GUAIFENESIN ER 600 MG PO TB12
600.0000 mg | ORAL_TABLET | Freq: Two times a day (BID) | ORAL | Status: DC
  Administered 2020-03-18 – 2020-03-19 (×2): 600 mg via ORAL
  Filled 2020-03-18 (×3): qty 1

## 2020-03-18 MED ORDER — ALBUTEROL SULFATE HFA 108 (90 BASE) MCG/ACT IN AERS
2.0000 | INHALATION_SPRAY | Freq: Four times a day (QID) | RESPIRATORY_TRACT | Status: DC
  Administered 2020-03-18 – 2020-03-19 (×6): 2 via RESPIRATORY_TRACT

## 2020-03-18 MED ORDER — FINASTERIDE 5 MG PO TABS
5.0000 mg | ORAL_TABLET | Freq: Every day | ORAL | Status: DC
  Administered 2020-03-19: 5 mg via ORAL
  Filled 2020-03-18 (×2): qty 1

## 2020-03-18 MED ORDER — TAMSULOSIN HCL 0.4 MG PO CAPS
0.4000 mg | ORAL_CAPSULE | Freq: Every day | ORAL | Status: DC
  Filled 2020-03-18: qty 1

## 2020-03-18 MED ORDER — GABAPENTIN 100 MG PO CAPS
200.0000 mg | ORAL_CAPSULE | Freq: Two times a day (BID) | ORAL | Status: DC
  Administered 2020-03-18 – 2020-03-19 (×2): 200 mg via ORAL
  Filled 2020-03-18 (×3): qty 2

## 2020-03-18 NOTE — Progress Notes (Addendum)
PROGRESS NOTE   Scott Harvey  VOH:[id number] DOB: [birth year] DOA: 03/15/2020 PCP: Asencion Noble, MD   Chief Complaint  Patient presents with  . Fall   Level of care: Telemetry  Brief Admission History:  87 y.o. male with medical history significant of osteoarthritis, bladder cancer, stage III CKD, dry skin, history of GI bleed due to aspirin use, history of renal cell carcinoma with left nephrectomy, impaired hearing, macular degeneration of both eyes, right renal cyst, history of herpes zoster who is coming to the emergency department via EMS after family last saw him on Saturday, but were concerned today since the patient had not been answering the phone and was subsequently found altered on the bathroom floor.  EMS described his oxygen saturation was 84% on room air and placed on supplemental oxygen.    Assessment & Plan:   Principal Problem:   Pneumonia due to COVID-19 virus Active Problems:   Class 2 obesity   Acute renal failure superimposed on stage 3b chronic kidney disease (HCC)   Proteinuria   Hypoalbuminemia   Acute respiratory failure with hypoxia (HCC)    1)Acute respiratory failure with hypoxia secondary to Covid pneumonia - Continue bronchodilators and supportive measures.  -Continue baricitinib, prednisone,  zinc and vitamin C -Patient completed IV remdesivir on 03/17/2020   03/18/20 -Episodes of confusion disorientation and agitation are less frequent and less pronounced -July 30, 2022 use as needed Haldol IM and as needed lorazepam IV  COVID-19 Labs  Recent Labs    03/16/20 0502 03/17/20 0454 03/18/20 0610  DDIMER 2.12* 1.85* 2.83*  FERRITIN 882* 736* 578*  CRP 6.9* 3.7* 2.4*    Lab Results  Component Value Date   SARSCOV2NAA POSITIVE (A) 03/15/2020   [place] Not Detected 12/23/2018    2)AKI on CKD stage 3b -creatinine is stable around 2 -- continue to maintain adequate hydration -Restart bicarb tablets - renally adjust medications, avoid  nephrotoxic agents / dehydration  / hypotension --Patient is status post prior nephrectomy and has only one kidney  3)BPH--c/n  Flomax and Proscar  4)Generalized weakness and deconditioning--- PT recommends SNF rehab patient and family are Now more willing to consider SNF rehab  Transfer to SNF facility when steroid-induced psychosis improved  5) steroid-induced psychosis with behavioral disturbance-----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 prednisone -Overall improving -30-Jul-2022 use as needed Haldol IM and as needed lorazepam IV  6) rheumatoid arthritis--- continue to hold hydroxychloroquine while on steroids, no rheumatoid arthritis flareup at this time consider restarting Entresto chloroquine after completing steroid therapy for Covid  7) postherpetic neuralgia--- restart gabapentin at 200 mg twice daily  DVT prophylaxis: enoxaparin Code Status: full  Family Communication: Discussed with daughter Levada Dy on 03/15/2020 Disposition: TBD Status is: Inpatient  Remains inpatient appropriate because:IV treatments appropriate due to intensity of illness or inability to take PO and Inpatient level of care appropriate due to severity of illness --Worsening hypoxia, confusion/metabolic encephalopathy  Dispo: The patient is from: Home              Anticipated d/c is to: SNF                Anticipated d/c date is: 1 day              Patient currently is medically stable to d/c.   Difficult to place patient No --   Consultants:     Procedures:     Antimicrobials:     Subjective:  Less confused,  more cooperative, more redirectable   Objective: Vitals:   03/18/20 0747 03/18/20 0838 03/18/20 1119 03/18/20 1200  BP:  116/84  (!) 124/98  Pulse: 92 80 95 83  Resp: (!) 26 (!) 23 (!) 29 (!) 24  Temp:  (!) 96.8 F (36 C) (!) 96.4 F (35.8 C) (!) 96.4 F (35.8 C)  TempSrc:  Axillary Axillary Axillary  SpO2: (!) 87% 95% 95% 95%  Weight:       Height:        Intake/Output Summary (Last 24 hours) at 03/18/2020 1338 Last data filed at 03/18/2020 0600 Gross per 24 hour  Intake --  Output 1475 ml  Net -1475 ml   Filed Weights   03/16/2020 2102 03/14/20 1243 03/16/20 0330  Weight: 116.1 kg 107.1 kg 107.8 kg    Examination:  General exam: frail elderly male, awake, no acute distress Nose- [place] 3 L/min Respiratory system: Improved air movement, no wheezing Cardiovascular system: normal S1 & S2 heard. No JVD, murmurs, rubs,  Gastrointestinal system: Abdomen is nondistended, soft and nontender.  . Normal bowel sounds heard. Central nervous system: Generalized weakness, no new focal neurological deficits. Extremities: Symmetric 5 x 5 power. Skin: No rashes, lesions or ulcers Psychiatry: -Episodes of confusion disorientation and agitation are less frequent and less pronounced  Data Reviewed: I have personally reviewed following labs and imaging studies  CBC: Recent Labs  Lab 03/14/20 0729 03/15/20 0447 03/16/20 0502 03/17/20 0454 03/18/20 0610  WBC 5.6 5.4 6.2 6.2 8.9  NEUTROABS 5.0 4.8 5.6 5.5 7.2  HGB 13.1 12.4* 13.4 12.8* 14.6  HCT 44.0 41.3 44.0 43.2 48.7  MCV 94.2 93.0 92.6 94.1 93.1  PLT 167 189 250 248 829    Basic Metabolic Panel: Recent Labs  Lab 03/14/20 0729 03/15/20 0447 03/16/20 0502 03/17/20 0454 03/18/20 0610  NA 144 143 142 143 146*  K 4.5 3.9 3.9 4.5 4.3  CL 111 113* 113* 116* 116*  CO2 21* 20* 20* 22 23  GLUCOSE 109* 129* 136* 125* 83  BUN 46* 63* 64* 62* 65*  CREATININE 2.13* 2.19* 1.96* 1.81* 1.89*  CALCIUM 8.5* 8.2* 8.4* 8.6* 8.9  MG  --  2.0  --   --   --   PHOS  --  4.2  --   --   --     GFR: Estimated Creatinine Clearance: 32 mL/min (A) (by C-G formula based on SCr of 1.89 mg/dL (H)).  Liver Function Tests: Recent Labs  Lab 03/14/20 0729 03/15/20 0447 03/16/20 0502 03/17/20 0454 03/18/20 0610  AST 85* 66* 57* 68* 93*  ALT 53* 47* 47* 56* 95*  ALKPHOS 73 65 70 65 73   BILITOT 0.6 0.5 0.5 0.6 0.6  PROT 6.4* 5.7* 6.1* 5.8* 6.4*  ALBUMIN 2.7* 2.4* 2.6* 2.5* 2.7*    CBG: No results for input(s): GLUCAP in the last 168 hours.  Recent Results (from the past 240 hour(s))  Urine culture     Status: Abnormal   Collection Time: 03/05/2020  9:14 PM   Specimen: Urine, Clean Catch  Result Value Ref Range Status   Specimen Description   Final    URINE, CLEAN CATCH Performed at [facility], [address], [place], [place] [address]    Special Requests   Final    NONE Performed at [facility], [address], [place], [place] [address]    Culture (A)  Final    >=100,000 COLONIES/mL GROUP B STREP(S.AGALACTIAE)ISOLATED TESTING AGAINST S. AGALACTIAE NOT ROUTINELY PERFORMED  DUE TO PREDICTABILITY OF AMP/PEN/VAN SUSCEPTIBILITY. Performed at [facility], [place] [address] [place], [place] [address]    Report Status 03/16/2020 FINAL  Final  SARS Coronavirus 2 by RT PCR (hospital order, performed in [facility] hospital lab) Nasopharyngeal Nasopharyngeal Swab     Status: Abnormal   Collection Time: 03/09/2020  9:15 PM   Specimen: Nasopharyngeal Swab  Result Value Ref Range Status   SARS Coronavirus 2 POSITIVE (A) NEGATIVE Final    Comment: RESULT CALLED TO, READ BACK BY AND VERIFIED WITH: R THOMPSON,RN@2304  02/27/2020 MKELLY (NOTE) SARS-CoV-2 target nucleic acids are DETECTED  SARS-CoV-2 RNA is generally detectable in upper respiratory specimens  during the acute phase of infection.  Positive results are indicative  of the presence of the identified virus, but do not rule out bacterial infection or co-infection with other pathogens not detected by the test.  Clinical correlation with patient history and  other diagnostic information is necessary to determine patient infection status.  The expected result is negative.  Fact Sheet for Patients:   StrictlyIdeas.no   Fact Sheet for Healthcare Providers:    BankingDealers.co.za    This test is not yet approved or cleared by the [place] FDA and  has been authorized for detection and/or diagnosis of SARS-CoV-2 by FDA under an Emergency Use Authorization (EUA).  This EUA will remain in effect (meaning this te st can be used) for the duration of  the COVID-19 declaration under Section 564(b)(1) of the Act, 21 U.S.C. section 360-bbb-3(b)(1), unless the authorization is terminated or revoked sooner.  Performed at [facility], [address], [place], [place] [address]   Blood Culture (routine x 2)     Status: None   Collection Time: 03/12/2020 11:42 PM   Specimen: BLOOD  Result Value Ref Range Status   Specimen Description BLOOD LEFT ANTECUBITAL  Final   Special Requests   Final    BOTTLES DRAWN AEROBIC AND ANAEROBIC Blood Culture adequate volume   Culture   Final    NO GROWTH 5 DAYS Performed at [facility], [address], [place], [place] [address]    Report Status 03/18/2020 FINAL  Final  Blood Culture (routine x 2)     Status: None   Collection Time: 03/02/2020 11:42 PM   Specimen: BLOOD RIGHT HAND  Result Value Ref Range Status   Specimen Description BLOOD RIGHT HAND  Final   Special Requests   Final    BOTTLES DRAWN AEROBIC AND ANAEROBIC Blood Culture adequate volume   Culture   Final    NO GROWTH 5 DAYS Performed at [facility], [address], [place],  [address]    Report Status 03/18/2020 FINAL  Final  MRSA PCR Screening     Status: None   Collection Time: 03/14/20 12:48 PM   Specimen: Nasal Mucosa; Nasopharyngeal  Result Value Ref Range Status   MRSA by PCR NEGATIVE NEGATIVE Final    Comment:        The GeneXpert MRSA Assay (FDA approved for NASAL specimens only), is one component of a comprehensive MRSA colonization surveillance program. It is not intended to diagnose MRSA infection nor to guide or monitor treatment for MRSA infections. Performed at [facility], [address] [place],  [address]     Radiology Studies: No results found.  Scheduled Meds: . vitamin C  500 mg Oral Daily  . baricitinib  2 mg Oral Daily  . Chlorhexidine Gluconate Cloth  6 each Topical Daily  .  enoxaparin (LOVENOX) injection  40 mg Subcutaneous Q24H  . predniSONE  50 mg Oral Q breakfast  . zinc sulfate  220 mg Oral Daily   Continuous Infusions: . sodium chloride 20 mL/hr at 03/15/20 1908    LOS: 4 days   Roxan Hockey, MD How to contact the [facility] Attending or Consulting provider [place] or covering provider during after hours [place], for this patient?  1. Check the care team in [facility] and look for a) attending/consulting [facility] provider listed and b) the [facility] team listed 2. Log into [email or web address] and use [place]'s universal password to access. If you do not have the password, please contact the hospital operator. 3. Locate the [facility] provider you are looking for under [facility] and page to a number that you can be directly reached. 4. If you still have difficulty reaching the provider, please page the [facility] (Director on Call) for the Hospitalists listed on amion for assistance.  03/18/2020, 1:38 PM

## 2020-03-18 NOTE — Progress Notes (Signed)
Patient remains on o2 via [place] at 3L. Tolerating well at this time, but does have intermittent episodes of agitation and confusion and haldol IM given x1 and Ativan IV given x1 with good effect. Patient has pulled off his leads and his oxygen multiple times. Emotional presence and therapeutic communication minimally effective. Frequent rounding and bed alarm on.

## 2020-03-18 NOTE — Progress Notes (Signed)
Patient agitated again, pulled off o2, leads and oxygen sat probe. Also pulled out IV. Helped reposition in bed and mitts were placed on patient. MD aware. No new orders. Will monitor

## 2020-03-19 ENCOUNTER — Inpatient Hospital Stay (HOSPITAL_COMMUNITY): Payer: Medicare HMO

## 2020-03-19 DIAGNOSIS — U071 COVID-19: Secondary | ICD-10-CM | POA: Diagnosis not present

## 2020-03-19 DIAGNOSIS — J1282 Pneumonia due to coronavirus disease 2019: Secondary | ICD-10-CM | POA: Diagnosis not present

## 2020-03-19 LAB — GLUCOSE, CAPILLARY: Glucose-Capillary: 130 mg/dL — ABNORMAL HIGH (ref 70–99)

## 2020-03-19 MED ORDER — ALBUTEROL SULFATE (2.5 MG/3ML) 0.083% IN NEBU
INHALATION_SOLUTION | RESPIRATORY_TRACT | Status: AC
  Administered 2020-03-20: 5 mg
  Filled 2020-03-19: qty 6

## 2020-03-19 MED ORDER — IPRATROPIUM BROMIDE 0.02 % IN SOLN
RESPIRATORY_TRACT | Status: AC
  Administered 2020-03-20: 0.5 mg
  Filled 2020-03-19: qty 2.5

## 2020-03-19 MED ORDER — DEXTROSE 5 % IV SOLN: INTRAVENOUS | Status: DC

## 2020-03-19 MED ORDER — ALBUTEROL SULFATE (2.5 MG/3ML) 0.083% IN NEBU
INHALATION_SOLUTION | RESPIRATORY_TRACT | Status: AC
  Administered 2020-03-19: 5 mg
  Filled 2020-03-19: qty 6

## 2020-03-19 MED ORDER — IPRATROPIUM BROMIDE 0.02 % IN SOLN
RESPIRATORY_TRACT | Status: AC
  Administered 2020-03-19: 0.5 mg
  Filled 2020-03-19: qty 2.5

## 2020-03-19 NOTE — Progress Notes (Signed)
PROGRESS NOTE   Scott Harvey  EZM:[id number] DOB: Dec 11, 1928 DOA: 03/12/2020 PCP: Asencion Noble, MD   Chief Complaint  Patient presents with  . Fall   Level of care: Telemetry  Brief Admission History:  87 y.o. male with medical history significant of osteoarthritis, bladder cancer, stage III CKD, dry skin, history of GI bleed due to aspirin use, history of renal cell carcinoma with left nephrectomy, impaired hearing, macular degeneration of both eyes, right renal cyst, history of herpes zoster who is coming to the emergency department via EMS after family last saw him on Saturday, but were concerned today since the patient had not been answering the phone and was subsequently found altered on the bathroom floor.  EMS described his oxygen saturation was 84% on room air and placed on supplemental oxygen.    Assessment & Plan:   Principal Problem:   Pneumonia due to COVID-19 virus Active Problems:   Class 2 obesity   Acute renal failure superimposed on stage 3b chronic kidney disease (HCC)   Proteinuria   Hypoalbuminemia   Acute respiratory failure with hypoxia (HCC)    1)Acute respiratory failure with hypoxia secondary to Covid pneumonia - Continue bronchodilators and supportive measures.  -Continue baricitinib, prednisone,  zinc and vitamin C -Patient completed IV remdesivir on 03/17/2020 -Repeat chest x-ray on 03/19/2020 consistent with already known Covid pneumonia   03/19/20 -Continues to have episodes of restlessness, agitation and confusion -May use as needed Haldol IM and as needed lorazepam IV  COVID-19 Labs  Recent Labs    03/17/20 0454 03/18/20 0610  DDIMER 1.85* 2.83*  FERRITIN 736* 578*  CRP 3.7* 2.4*    Lab Results  Component Value Date   SARSCOV2NAA POSITIVE (A) 03/02/2020   [place] Not Detected 12/23/2018    2)AKI on CKD stage 3b -creatinine is stable around 2 -- continue to maintain adequate hydration -c/n bicarb tablets - renally adjust  medications, avoid nephrotoxic agents / dehydration  / hypotension --Patient is status post prior nephrectomy and has only one kidney  3)BPH--c/n  Flomax and Proscar  4)Generalized weakness and deconditioning--- PT recommends SNF rehab patient and family are Now more willing to consider SNF rehab  Transfer to SNF facility when steroid-induced psychosis improved  5) steroid-induced psychosis with behavioral disturbance-----Suspect steroid-induced psychosis--- we already stopped IV Decadron and transitioned to p.o. prednisone -Overall improving -May use as needed Haldol IM and as needed lorazepam IV  6) rheumatoid arthritis--- continue to hold hydroxychloroquine while on steroids, no rheumatoid arthritis flareup at this time consider restarting Entresto chloroquine after completing steroid therapy for Covid  7) postherpetic neuralgia--- continue gabapentin at 200 mg twice daily  8) hypernatremia--due to free water deficit in the setting of decreased oral intake associated with episodes of agitation and restlessness that at times require sedative medications -D5 water as ordered until oral intake is more reliable  9) elevated LFTs--- AST 93 today was 94 on 02/20/2020 -ALT is 95 today was 59 on 03/03/2020 -Bili and alk phos is not elevated --LFTs have been elevated but rather stable overall--patient already completed remdesivir -Patient does not appear to be on any medications notorious for being hepatotoxic  DVT prophylaxis: enoxaparin Code Status: full  Family Communication: Discussed with daughter Levada Dy on 03/15/2020 Disposition: TBD Status is: Inpatient  Remains inpatient appropriate because:IV treatments appropriate due to intensity of illness or inability to take PO and Inpatient level of care appropriate due to severity of illness -Anticipate possible discharge to SNF facility on 03/20/2020  Dispo: The patient is from: Home              Anticipated d/c is to: SNF                 Anticipated d/c date is: 1 day              Patient currently is medically stable to d/c.   Difficult to place patient No --   Consultants:     Procedures:     Antimicrobials:     Subjective:  Confusion and restlessness persist in this 87 year old male -Daughter well mostly with redirection  -Has not required a lot of Haldol or Ativan -Diminished oral intake is a concern  Objective: Vitals:   03/19/20 0700 03/19/20 0745 03/19/20 0800 03/19/20 0856  BP:      Pulse: 98 97 91   Resp: (!) 22     Temp:  98 F (36.7 C)    TempSrc:  Axillary    SpO2: (!) 88% (!) 85% 90% 90%  Weight:      Height:        Intake/Output Summary (Last 24 hours) at 03/19/2020 1000 Last data filed at 03/18/2020 1800 Gross per 24 hour  Intake 720 ml  Output 550 ml  Net 170 ml   Filed Weights   03/14/2020 2102 03/14/20 1243 03/16/20 0330  Weight: 116.1 kg 107.1 kg 107.8 kg    Examination:  General exam: frail elderly male, awake, no acute distress Nose- [place] 2 L/min Respiratory system: Improved air movement, no wheezing Cardiovascular system: normal S1 & S2 heard. No JVD, murmurs, rubs,  Gastrointestinal system: Abdomen is nondistended, soft and nontender.  . Normal bowel sounds heard. Central nervous system: Generalized weakness, no new focal neurological deficits. Extremities: Symmetric 5 x 5 power. Skin: No rashes, lesions or ulcers Psychiatry: -Episodes of confusion disorientation and agitation are less frequent and less pronounced  Data Reviewed: I have personally reviewed following labs and imaging studies  CBC: Recent Labs  Lab 03/14/20 0729 03/15/20 0447 03/16/20 0502 03/17/20 0454 03/18/20 0610  WBC 5.6 5.4 6.2 6.2 8.9  NEUTROABS 5.0 4.8 5.6 5.5 7.2  HGB 13.1 12.4* 13.4 12.8* 14.6  HCT 44.0 41.3 44.0 43.2 48.7  MCV 94.2 93.0 92.6 94.1 93.1  PLT 167 189 250 248 517    Basic Metabolic Panel: Recent Labs  Lab 03/14/20 0729 03/15/20 0447 03/16/20 0502 03/17/20 0454  03/18/20 0610  NA 144 143 142 143 146*  K 4.5 3.9 3.9 4.5 4.3  CL 111 113* 113* 116* 116*  CO2 21* 20* 20* 22 23  GLUCOSE 109* 129* 136* 125* 83  BUN 46* 63* 64* 62* 65*  CREATININE 2.13* 2.19* 1.96* 1.81* 1.89*  CALCIUM 8.5* 8.2* 8.4* 8.6* 8.9  MG  --  2.0  --   --   --   PHOS  --  4.2  --   --   --     GFR: Estimated Creatinine Clearance: 32 mL/min (A) (by C-G formula based on SCr of 1.89 mg/dL (H)).  Liver Function Tests: Recent Labs  Lab 03/14/20 0729 03/15/20 0447 03/16/20 0502 03/17/20 0454 03/18/20 0610  AST 85* 66* 57* 68* 93*  ALT 53* 47* 47* 56* 95*  ALKPHOS 73 65 70 65 73  BILITOT 0.6 0.5 0.5 0.6 0.6  PROT 6.4* 5.7* 6.1* 5.8* 6.4*  ALBUMIN 2.7* 2.4* 2.6* 2.5* 2.7*    CBG: No results for input(s): GLUCAP in the last 168 hours.  Recent Results (from the past 240 hour(s))  Urine culture     Status: Abnormal   Collection Time: 03/12/2020  9:14 PM   Specimen: Urine, Clean Catch  Result Value Ref Range Status   Specimen Description   Final    URINE, CLEAN CATCH Performed at [facility], [address] [place], [place] [address]    Special Requests   Final    NONE Performed at [facility], [address] [place], [place] [address]    Culture (A)  Final    >=100,000 COLONIES/mL GROUP B STREP(S.AGALACTIAE)ISOLATED TESTING AGAINST S. AGALACTIAE NOT ROUTINELY PERFORMED DUE TO PREDICTABILITY OF AMP/PEN/VAN SUSCEPTIBILITY. Performed at [facility], [place] [address], [place], [place] [address]    Report Status 03/16/2020 FINAL  Final  SARS Coronavirus 2 by RT PCR (hospital order, performed in [facility] hospital lab) Nasopharyngeal Nasopharyngeal Swab     Status: Abnormal   Collection Time: 03/08/2020  9:15 PM   Specimen: Nasopharyngeal Swab  Result Value Ref Range Status   SARS Coronavirus 2 POSITIVE (A) NEGATIVE Final    Comment: RESULT CALLED TO, READ BACK BY AND VERIFIED WITH: R THOMPSON,RN'@[id number]'  02/19/2020 MKELLY (NOTE) SARS-CoV-2 target nucleic acids  are DETECTED  SARS-CoV-2 RNA is generally detectable in upper respiratory specimens  during the acute phase of infection.  Positive results are indicative  of the presence of the identified virus, but do not rule out bacterial infection or co-infection with other pathogens not detected by the test.  Clinical correlation with patient history and  other diagnostic information is necessary to determine patient infection status.  The expected result is negative.  Fact Sheet for Patients:   StrictlyIdeas.no   Fact Sheet for Healthcare Providers:   BankingDealers.co.za    This test is not yet approved or cleared by the [place] FDA and  has been authorized for detection and/or diagnosis of SARS-CoV-2 by FDA under an Emergency Use Authorization (EUA).  This EUA will remain in effect (meaning this te st can be used) for the duration of  the COVID-19 declaration under Section 564(b)(1) of the Act, 21 U.S.C. section 360-bbb-3(b)(1), unless the authorization is terminated or revoked sooner.  Performed at [facility], [address] [place], [place] [address]   Blood Culture (routine x 2)     Status: None   Collection Time: 03/03/2020 11:42 PM   Specimen: BLOOD  Result Value Ref Range Status   Specimen Description BLOOD LEFT ANTECUBITAL  Final   Special Requests   Final    BOTTLES DRAWN AEROBIC AND ANAEROBIC Blood Culture adequate volume   Culture   Final    NO GROWTH 5 DAYS Performed at [facility], [address], [place], [place] [address]    Report Status 03/18/2020 FINAL  Final  Blood Culture (routine x 2)     Status: None   Collection Time: 03/04/2020 11:42 PM   Specimen: BLOOD RIGHT HAND  Result Value Ref Range Status   Specimen Description BLOOD RIGHT HAND  Final   Special Requests   Final    BOTTLES DRAWN AEROBIC AND ANAEROBIC Blood Culture adequate volume   Culture   Final    NO GROWTH 5 DAYS Performed at [facility], [address], [place], [place] [address]    Report Status 03/18/2020 FINAL  Final  MRSA PCR Screening     Status: None   Collection Time: 03/14/20 12:48 PM   Specimen: Nasal Mucosa; Nasopharyngeal  Result Value Ref Range Status  MRSA by PCR NEGATIVE NEGATIVE Final    Comment:        The GeneXpert MRSA Assay (FDA approved for NASAL specimens only), is one component of a comprehensive MRSA colonization surveillance program. It is not intended to diagnose MRSA infection nor to guide or monitor treatment for MRSA infections. Performed at [facility], [address], [place], [place] [address]     Radiology Studies: DG CHEST PORT 1 VIEW  Result Date: 03/19/2020 CLINICAL DATA:  Dyspnea, respiratory abnormalities, COVID-19 positive on 02/22/2020 in a past history of bladder cancer and renal cell carcinoma, former smoker EXAM: PORTABLE CHEST 1 VIEW COMPARISON:  Portable exam 0911 hours compared to 02/21/2020 FINDINGS: Normal heart size, mediastinal contours, and pulmonary vascularity. Patchy infiltrates bilaterally consistent with multifocal pneumonia and history of COVID-19. No pleural effusion or pneumothorax. Bones demineralized. IMPRESSION: Persistent BILATERAL pulmonary infiltrates consistent with multifocal pneumonia and COVID-19. Electronically Signed   By: Lavonia Dana M.D.   On: 03/19/2020 09:32    Scheduled Meds: . albuterol  2 puff Inhalation QID  . vitamin C  500 mg Oral Daily  . baricitinib  2 mg Oral Daily  . Chlorhexidine Gluconate Cloth  6 each Topical Daily  . enoxaparin (LOVENOX) injection  40 mg Subcutaneous Q24H  . finasteride  5 mg Oral Daily  . gabapentin  200 mg Oral BID  . guaiFENesin  600 mg Oral BID  . predniSONE  50 mg Oral Q breakfast  . sodium bicarbonate  650 mg Oral BID  . tamsulosin  0.4 mg Oral QPC supper  . zinc sulfate  220 mg Oral Daily   Continuous Infusions: . sodium chloride Stopped (03/18/20 1621)  . dextrose      LOS: 5 days    Roxan Hockey, MD How to contact the [facility] Attending or Consulting provider [place] or covering provider during after hours [place], for this patient?  1. Check the care team in [facility] and look for a) attending/consulting [facility] provider listed and b) the [facility] team listed 2. Log into [email or web address] and use [place]'s universal password to access. If you do not have the password, please contact the hospital operator. 3. Locate the [facility] provider you are looking for under [facility] and page to a number that you can be directly reached. 4. If you still have difficulty reaching the provider, please page the [facility] (Director on Call) for the Hospitalists listed on amion for assistance.  03/19/2020, 10:00 AM

## 2020-03-19 NOTE — Progress Notes (Signed)
NTSX copious amount of yellow thick secretion , gave one time dose albuterol/atrovent neb Mdi basically ineffective. Patient on 8 liters only 90 percent on arrival to room.

## 2020-03-19 NOTE — Progress Notes (Signed)
Pt has remained restless all night despite medication. Elevators are down, we will be unable to perform CT per order until they are fixed. Pt currently sleeping, will continue to monitor.

## 2020-03-20 ENCOUNTER — Inpatient Hospital Stay (HOSPITAL_COMMUNITY): Payer: Medicare HMO

## 2020-03-20 DIAGNOSIS — U071 COVID-19: Secondary | ICD-10-CM | POA: Diagnosis not present

## 2020-03-20 DIAGNOSIS — J1282 Pneumonia due to coronavirus disease 2019: Secondary | ICD-10-CM | POA: Diagnosis not present

## 2020-03-20 LAB — GLUCOSE, CAPILLARY
Glucose-Capillary: 100 mg/dL — ABNORMAL HIGH (ref 70–99)
Glucose-Capillary: 103 mg/dL — ABNORMAL HIGH (ref 70–99)
Glucose-Capillary: 113 mg/dL — ABNORMAL HIGH (ref 70–99)
Glucose-Capillary: 75 mg/dL (ref 70–99)
Glucose-Capillary: 99 mg/dL (ref 70–99)

## 2020-03-20 LAB — COMPREHENSIVE METABOLIC PANEL
ALT: 90 U/L — ABNORMAL HIGH (ref 0–44)
AST: 50 U/L — ABNORMAL HIGH (ref 15–41)
Albumin: 2.8 g/dL — ABNORMAL LOW (ref 3.5–5.0)
Alkaline Phosphatase: 78 U/L (ref 38–126)
Anion gap: 11 (ref 5–15)
BUN: 59 mg/dL — ABNORMAL HIGH (ref 8–23)
CO2: 20 mmol/L — ABNORMAL LOW (ref 22–32)
Calcium: 9 mg/dL (ref 8.9–10.3)
Chloride: 115 mmol/L — ABNORMAL HIGH (ref 98–111)
Creatinine, Ser: 1.81 mg/dL — ABNORMAL HIGH (ref 0.61–1.24)
GFR, Estimated: 35 mL/min — ABNORMAL LOW (ref >60)
Glucose, Bld: 113 mg/dL — ABNORMAL HIGH (ref 70–99)
Potassium: 5.1 mmol/L (ref 3.5–5.1)
Sodium: 146 mmol/L — ABNORMAL HIGH (ref 135–145)
Total Bilirubin: 0.8 mg/dL (ref 0.3–1.2)
Total Protein: 6.6 g/dL (ref 6.5–8.1)

## 2020-03-20 LAB — CBC
HCT: 48.7 % (ref 39.0–52.0)
Hemoglobin: 14.6 g/dL (ref 13.0–17.0)
MCH: 28.1 pg (ref 26.0–34.0)
MCHC: 30 g/dL (ref 30.0–36.0)
MCV: 93.8 fL (ref 80.0–100.0)
Platelets: 399 10*3/uL (ref 150–400)
RBC: 5.19 MIL/uL (ref 4.22–5.81)
RDW: 15.6 % — ABNORMAL HIGH (ref 11.5–15.5)
WBC: 6.8 10*3/uL (ref 4.0–10.5)
nRBC: 0.4 % — ABNORMAL HIGH (ref 0.0–0.2)

## 2020-03-20 LAB — PROCALCITONIN: Procalcitonin: 0.56 ng/mL

## 2020-03-20 LAB — D-DIMER, QUANTITATIVE: D-Dimer, Quant: 3.83 ug/mL-FEU — ABNORMAL HIGH (ref 0.00–0.50)

## 2020-03-20 MED ORDER — SODIUM CHLORIDE 0.9 % IV BOLUS
500.0000 mL | Freq: Once | INTRAVENOUS | Status: AC
  Administered 2020-03-20: 500 mL via INTRAVENOUS

## 2020-03-20 MED ORDER — ENOXAPARIN SODIUM 40 MG/0.4ML ~~LOC~~ SOLN: 40.0000 mg | Freq: Two times a day (BID) | SUBCUTANEOUS | Status: DC

## 2020-03-20 MED ORDER — MORPHINE SULFATE (PF) 2 MG/ML IV SOLN
2.0000 mg | Freq: Four times a day (QID) | INTRAVENOUS | Status: DC | PRN
Start: 2020-03-20 — End: 2020-03-22

## 2020-03-20 MED ORDER — ENOXAPARIN SODIUM 60 MG/0.6ML ~~LOC~~ SOLN
55.0000 mg | SUBCUTANEOUS | Status: DC
  Administered 2020-03-20: 55 mg via SUBCUTANEOUS
  Filled 2020-03-20: qty 0.6

## 2020-03-20 MED ORDER — HYDRALAZINE HCL 20 MG/ML IJ SOLN: 10.0000 mg | Freq: Four times a day (QID) | INTRAMUSCULAR | Status: DC | PRN

## 2020-03-20 MED ORDER — SODIUM CHLORIDE 0.9 % IV SOLN
3.0000 g | Freq: Three times a day (TID) | INTRAVENOUS | Status: DC
  Administered 2020-03-20 – 2020-03-21 (×4): 3 g via INTRAVENOUS
  Filled 2020-03-20 (×3): qty 8
  Filled 2020-03-20 (×2): qty 3
  Filled 2020-03-20 (×2): qty 8

## 2020-03-20 MED ORDER — DEXTROSE 5 % IV SOLN: INTRAVENOUS | Status: DC

## 2020-03-20 NOTE — Progress Notes (Signed)
PROGRESS NOTE   Scott Harvey  DGL:[id number] DOB: 1928/08/29 DOA: 02/24/2020 PCP: Asencion Noble, MD   Chief Complaint  Patient presents with  . Fall   Level of care: Stepdown  Brief Admission History:  87 y.o. male with medical history significant of osteoarthritis, bladder cancer, stage III CKD, dry skin, history of GI bleed due to aspirin use, history of renal cell carcinoma with left nephrectomy, impaired hearing, macular degeneration of both eyes, right renal cyst, history of herpes zoster who is coming to the emergency department via EMS after family last saw him on Saturday, but were concerned today since the patient had not been answering the phone and was subsequently found altered on the bathroom floor.  EMS described his oxygen saturation was 84% on room air and placed on supplemental oxygen.    Assessment & Plan:   Principal Problem:   Pneumonia due to COVID-19 virus Active Problems:   Class 2 obesity   Acute renal failure superimposed on stage 3b chronic kidney disease (HCC)   Proteinuria   Hypoalbuminemia   Acute respiratory failure with hypoxia (HCC)    1)Acute respiratory failure with hypoxia secondary to Covid pneumonia - Continue bronchodilators and supportive measures.  -Continue baricitinib, prednisone,  zinc and vitamin C -Patient completed IV remdesivir on 03/17/2020 -Repeat chest x-ray on 03/19/2020 and 03/20/20 consistent with already known Covid pneumonia   03/20/20 Significant respiratory decompensation overnight with increased work of breathing on increased oxygen requirement--after further discussion with patient's son and patient's daughter--declined intubation request trial of BiPAP -D-dimer noted, lower extremity venous Dopplers negative for acute DVT -Concerns for possible aspiration pneumonia show IV Unasyn started on 03/20/20  COVID-19 Labs  Recent Labs    03/18/20 0610 03/20/20 1203  DDIMER 2.83* 3.83*  FERRITIN 578*  --   CRP 2.4*  --      Lab Results  Component Value Date   SARSCOV2NAA POSITIVE (A) 03/03/2020   [place] Not Detected 12/23/2018    2)AKI on CKD stage 3b -creatinine is stable around 2 -- continue to maintain adequate hydration -c/n bicarb tablets - renally adjust medications, avoid nephrotoxic agents / dehydration  / hypotension --Patient is status post prior nephrectomy and has only one kidney  3) social/ethics--- plan of care and CODE STATUS discussed with patient's son and patient's daughter patient is a DNR/DNI -They would like try BiPAP and continue current management otherwise -Overall prognosis is poor  4)BPH--c/n  Flomax and Proscar--when oral intake resume  5) possible aspiration pneumonia--IV Unasyn as above #1  6) rheumatoid arthritis--- continue to hold hydroxychloroquine while on steroids, no rheumatoid arthritis flareup at this time consider restarting   after completing steroid therapy for Covid  7) postherpetic neuralgia--- continue gabapentin at 200 mg twice daily-when oral intake resumes  8) hypernatremia--due to free water deficit in the setting of decreased oral intake associated with episodes of agitation and restlessness that at times require sedative medications -D5 water as ordered until oral intake is more reliable  9) elevated LFTs---  ALT and AST elevated, but largely stable at this time ---Bili and alk phos is not elevated --LFTs have been elevated but rather stable overall--patient already completed remdesivir -Patient does not appear to be on any medications notorious for being hepatotoxic  DVT prophylaxis: enoxaparin Code Status: full  Family Communication: Discussed with daughter Levada Dy  And son o  Disposition: TBD Status is: Inpatient  Remains inpatient appropriate because:IV treatments appropriate due to intensity of illness or inability to take PO  and Inpatient level of care appropriate due to severity of illness   Dispo: The patient is from: Home               Anticipated d/c is to: Critically ill                Anticipated d/c date is: 1 day              Patient currently is medically stable to d/c.   Difficult to place patient No --   Consultants:     Procedures:     Antimicrobials:     Subjective:  -Significant respiratory decompensation overnight now on BiPAP with 100% FiO2 --Overall prognosis is poor patient started on son at bedside visiting  Objective: Vitals:   03/20/20 1500 03/20/20 1600 03/20/20 1700 03/20/20 1800  BP: (!) 152/127 (!) 134/42  (!) 217/201  Pulse: (!) 46 (!) 45  (!) 48  Resp: (!) 45 (!) 45  (!) 43  Temp:   99 F (37.2 C)   TempSrc:   Axillary   SpO2: (!) 86% 92%  90%  Weight:      Height:        Intake/Output Summary (Last 24 hours) at 03/20/2020 1835 Last data filed at 03/20/2020 2119 Gross per 24 hour  Intake 629.64 ml  Output 300 ml  Net 329.64 ml   Filed Weights   02/21/2020 2102 03/14/20 1243 03/16/20 0330  Weight: 116.1 kg 107.1 kg 107.8 kg    Examination:  General exam: frail elderly male, increased work of breathing Nose-BiPAP Respiratory system: Diminished breath sounds, rhonchorous Cardiovascular system: normal S1 & S2 heard. No JVD, murmurs, rubs,  Gastrointestinal system: Abdomen is nondistended, soft and nontender.  . Normal bowel sounds heard. Central nervous system: Mostly unresponsive at this time  extremities: Symmetric 5 x 5 power. Skin: No rashes, lesions or ulcers Psychiatry: -Mostly unresponsive at this time   Data Reviewed: I have personally reviewed following labs and imaging studies  CBC: Recent Labs  Lab 03/14/20 0729 03/15/20 0447 03/16/20 0502 03/17/20 0454 03/18/20 0610 03/20/20 1203  WBC 5.6 5.4 6.2 6.2 8.9 6.8  NEUTROABS 5.0 4.8 5.6 5.5 7.2  --   HGB 13.1 12.4* 13.4 12.8* 14.6 14.6  HCT 44.0 41.3 44.0 43.2 48.7 48.7  MCV 94.2 93.0 92.6 94.1 93.1 93.8  PLT 167 189 250 248 305 417    Basic Metabolic Panel: Recent Labs  Lab  03/15/20 0447 03/16/20 0502 03/17/20 0454 03/18/20 0610 03/20/20 0609  NA 143 142 143 146* 146*  K 3.9 3.9 4.5 4.3 5.1  CL 113* 113* 116* 116* 115*  CO2 20* 20* 22 23 20*  GLUCOSE 129* 136* 125* 83 113*  BUN 63* 64* 62* 65* 59*  CREATININE 2.19* 1.96* 1.81* 1.89* 1.81*  CALCIUM 8.2* 8.4* 8.6* 8.9 9.0  MG 2.0  --   --   --   --   PHOS 4.2  --   --   --   --     GFR: Estimated Creatinine Clearance: 33.5 mL/min (A) (by C-G formula based on SCr of 1.81 mg/dL (H)).  Liver Function Tests: Recent Labs  Lab 03/15/20 0447 03/16/20 0502 03/17/20 0454 03/18/20 0610 03/20/20 0609  AST 66* 57* 68* 93* 50*  ALT 47* 47* 56* 95* 90*  ALKPHOS 65 70 65 73 78  BILITOT 0.5 0.5 0.6 0.6 0.8  PROT 5.7* 6.1* 5.8* 6.4* 6.6  ALBUMIN 2.4* 2.6* 2.5* 2.7* 2.8*  CBG: Recent Labs  Lab 03/19/20 2342 03/20/20 0552 03/20/20 0742 03/20/20 1205 03/20/20 1739  GLUCAP 130* 99 103* 113* 100*    Recent Results (from the past 240 hour(s))  Urine culture     Status: Abnormal   Collection Time: 03/14/2020  9:14 PM   Specimen: Urine, Clean Catch  Result Value Ref Range Status   Specimen Description   Final    URINE, CLEAN CATCH Performed at [facility], [address], [place], [place] [address]    Special Requests   Final    NONE Performed at [facility], [address] [place], [place] [address]    Culture (A)  Final    >=100,000 COLONIES/mL GROUP B STREP(S.AGALACTIAE)ISOLATED TESTING AGAINST S. AGALACTIAE NOT ROUTINELY PERFORMED DUE TO PREDICTABILITY OF AMP/PEN/VAN SUSCEPTIBILITY. Performed at [facility], [place] [address] [place], [place] [address]    Report Status 03/16/2020 FINAL  Final  SARS Coronavirus 2 by RT PCR (hospital order, performed in [facility] hospital lab) Nasopharyngeal Nasopharyngeal Swab     Status: Abnormal   Collection Time: 02/24/2020  9:15 PM   Specimen: Nasopharyngeal Swab  Result Value Ref Range Status   SARS Coronavirus 2 POSITIVE (A) NEGATIVE Final     Comment: RESULT CALLED TO, READ BACK BY AND VERIFIED WITH: R THOMPSON,RN'@[id number]'  03/05/2020 MKELLY (NOTE) SARS-CoV-2 target nucleic acids are DETECTED  SARS-CoV-2 RNA is generally detectable in upper respiratory specimens  during the acute phase of infection.  Positive results are indicative  of the presence of the identified virus, but do not rule out bacterial infection or co-infection with other pathogens not detected by the test.  Clinical correlation with patient history and  other diagnostic information is necessary to determine patient infection status.  The expected result is negative.  Fact Sheet for Patients:   StrictlyIdeas.no   Fact Sheet for Healthcare Providers:   BankingDealers.co.za    This test is not yet approved or cleared by the [place] FDA and  has been authorized for detection and/or diagnosis of SARS-CoV-2 by FDA under an Emergency Use Authorization (EUA).  This EUA will remain in effect (meaning this te st can be used) for the duration of  the COVID-19 declaration under Section 564(b)(1) of the Act, 21 U.S.C. section 360-bbb-3(b)(1), unless the authorization is terminated or revoked sooner.  Performed at [facility], [address], [place], [place] [address]   Blood Culture (routine x 2)     Status: None   Collection Time: 02/26/2020 11:42 PM   Specimen: BLOOD  Result Value Ref Range Status   Specimen Description BLOOD LEFT ANTECUBITAL  Final   Special Requests   Final    BOTTLES DRAWN AEROBIC AND ANAEROBIC Blood Culture adequate volume   Culture   Final    NO GROWTH 5 DAYS Performed at [facility], [address], [place], [place] [address]    Report Status 03/18/2020 FINAL  Final  Blood Culture (routine x 2)     Status: None   Collection Time: 02/25/2020 11:42 PM   Specimen: BLOOD RIGHT HAND  Result Value Ref Range Status   Specimen Description BLOOD RIGHT HAND  Final   Special Requests   Final     BOTTLES DRAWN AEROBIC AND ANAEROBIC Blood Culture adequate volume   Culture   Final    NO GROWTH 5 DAYS Performed at [facility], [address], [place], [place] [address]    Report Status 03/18/2020 FINAL  Final  MRSA PCR Screening  Status: None   Collection Time: 03/14/20 12:48 PM   Specimen: Nasal Mucosa; Nasopharyngeal  Result Value Ref Range Status   MRSA by PCR NEGATIVE NEGATIVE Final    Comment:        The GeneXpert MRSA Assay (FDA approved for NASAL specimens only), is one component of a comprehensive MRSA colonization surveillance program. It is not intended to diagnose MRSA infection nor to guide or monitor treatment for MRSA infections. Performed at [facility], [address], [place], [place] [address]     Radiology Studies: US Venous Img Lower Bilateral (DVT)  Result Date: 03/20/2020 CLINICAL DATA:  Respiratory failure secondary to COVID-19 pneumonia. Elevated D-dimer. EXAM: BILATERAL LOWER EXTREMITY VENOUS DOPPLER ULTRASOUND TECHNIQUE: Gray-scale sonography with graded compression, as well as color Doppler and duplex ultrasound were performed to evaluate the lower extremity deep venous systems from the level of the common femoral vein and including the common femoral, femoral, profunda femoral, popliteal and calf veins including the posterior tibial, peroneal and gastrocnemius veins when visible. The superficial great saphenous vein was also interrogated. Spectral Doppler was utilized to evaluate flow at rest and with distal augmentation maneuvers in the common femoral, femoral and popliteal veins. COMPARISON:  None. FINDINGS: RIGHT LOWER EXTREMITY Common Femoral Vein: No evidence of thrombus. Normal compressibility, respiratory phasicity and response to augmentation. Saphenofemoral Junction: No evidence of thrombus. Normal compressibility and flow on color Doppler imaging. Profunda Femoral Vein: No evidence of thrombus. Normal compressibility and flow on color Doppler  imaging. Femoral Vein: No evidence of thrombus. Normal compressibility, respiratory phasicity and response to augmentation. Popliteal Vein: No evidence of thrombus. Normal compressibility, respiratory phasicity and response to augmentation. Calf Veins: No evidence of thrombus. Normal compressibility and flow on color Doppler imaging. Superficial Great Saphenous Vein: No evidence of thrombus. Normal compressibility. Venous Reflux:  None. Other Findings: No evidence of superficial thrombophlebitis or abnormal fluid collection. LEFT LOWER EXTREMITY Common Femoral Vein: No evidence of thrombus. Normal compressibility, respiratory phasicity and response to augmentation. Saphenofemoral Junction: No evidence of thrombus. Normal compressibility and flow on color Doppler imaging. Profunda Femoral Vein: No evidence of thrombus. Normal compressibility and flow on color Doppler imaging. Femoral Vein: No evidence of thrombus. Normal compressibility, respiratory phasicity and response to augmentation. Popliteal Vein: No evidence of thrombus. Normal compressibility, respiratory phasicity and response to augmentation. Calf Veins: No evidence of thrombus. Normal compressibility and flow on color Doppler imaging. Superficial Great Saphenous Vein: No evidence of thrombus. Normal compressibility. Venous Reflux:  None. Other Findings: No evidence of superficial thrombophlebitis or abnormal fluid collection. IMPRESSION: No evidence of deep venous thrombosis in either lower extremity. Electronically Signed   By: Aletta Edouard M.D.   On: 03/20/2020 15:53   DG CHEST PORT 1 VIEW  Result Date: 03/20/2020 CLINICAL DATA:  Cough, dyspnea, COVID-19 positive EXAM: PORTABLE CHEST 1 VIEW COMPARISON:  03/19/2020 FINDINGS: Stable heart size. Prominence of the aortic knob suspicious for underlying thoracic aortic aneurysm. Streaky interstitial opacities bilaterally are similar to prior and compatible with multifocal viral pneumonia in the setting  of COVID 19 infection. No appreciable pleural fluid collection. No pneumothorax. IMPRESSION: 1. Persistent streaky interstitial opacities bilaterally compatible with multifocal viral pneumonia in the setting of COVID-19 infection. 2. Prominence of the aortic knob suspicious for underlying thoracic aortic aneurysm. Electronically Signed   By: Davina Poke D.O.   On: 03/20/2020 11:52   DG CHEST PORT 1 VIEW  Result Date: 03/19/2020 CLINICAL DATA:  Dyspnea, respiratory abnormalities, COVID-19 positive on 03/06/2020 in  a past history of bladder cancer and renal cell carcinoma, former smoker EXAM: PORTABLE CHEST 1 VIEW COMPARISON:  Portable exam 0911 hours compared to 03/10/2020 FINDINGS: Normal heart size, mediastinal contours, and pulmonary vascularity. Patchy infiltrates bilaterally consistent with multifocal pneumonia and history of COVID-19. No pleural effusion or pneumothorax. Bones demineralized. IMPRESSION: Persistent BILATERAL pulmonary infiltrates consistent with multifocal pneumonia and COVID-19. Electronically Signed   By: Lavonia Dana M.D.   On: 03/19/2020 09:32    Scheduled Meds: . albuterol  2 puff Inhalation QID  . vitamin C  500 mg Oral Daily  . baricitinib  2 mg Oral Daily  . Chlorhexidine Gluconate Cloth  6 each Topical Daily  . enoxaparin (LOVENOX) injection  55 mg Subcutaneous Q24H  . finasteride  5 mg Oral Daily  . gabapentin  200 mg Oral BID  . guaiFENesin  600 mg Oral BID  . predniSONE  50 mg Oral Q breakfast  . sodium bicarbonate  650 mg Oral BID  . tamsulosin  0.4 mg Oral QPC supper  . zinc sulfate  220 mg Oral Daily   Continuous Infusions: . sodium chloride Stopped (03/18/20 1102)  . ampicillin-sulbactam (UNASYN) IV 3 g (03/20/20 1614)  . dextrose 75 mL/hr at 03/20/20 1615    LOS: 6 days   Roxan Hockey, MD How to contact the [facility] Attending or Consulting provider [place] or covering provider during after hours [place], for this patient?  1. Check the care team in  [facility] and look for a) attending/consulting [facility] provider listed and b) the [facility] team listed 2. Log into [email or web address] and use [place]'s universal password to access. If you do not have the password, please contact the hospital operator. 3. Locate the [facility] provider you are looking for under [facility] and page to a number that you can be directly reached. 4. If you still have difficulty reaching the provider, please page the [facility] (Director on Call) for the Hospitalists listed on amion for assistance.  03/20/2020, 6:35 PM

## 2020-03-20 NOTE — Progress Notes (Addendum)
Patient is confused , Lungs sounds are rhonchi, respirations about 35, HR 98 , gave neb again in hopes of coughing up secretions  May have to NTSX patient again.

## 2020-03-20 NOTE — Progress Notes (Signed)
Temp noted  Medication given Room temp changed  ice packs started  BP low - IV Bolus started - Hospitalist ordered

## 2020-03-20 NOTE — Progress Notes (Signed)
RT NTS patient through right side nostril. RT suctioned for moderate amount of yellow thick secretions, RN was at bedside. RT increased High flow to 15L, patient SATs 93%, RR 38 and HR 116. RT will continue to monitor and assess.

## 2020-03-20 NOTE — Progress Notes (Signed)
Pharmacy Antibiotic Note  Scott Harvey is a 87 y.o. male admitted on 03/15/2020 with CoVid pneumonia, but  He aspirated overnight and has possible aspiration pneumonia. Pharmacy has been consulted for Unasyn dosing.  Plan: Start Unasyn 3g IV q8h Monitor labs, cultures and patient progress.  Height: 5\' 9"  (175.3 cm) Weight: 107.8 kg (237 lb 10.5 oz) IBW/kg (Calculated) : 70.7  Temp (24hrs), Avg:96.9 F (36.1 C), Min:96.3 F (35.7 C), Max:97.7 F (36.5 C)  Recent Labs  Lab 03/19/2020 2259 03/14/20 0145 03/14/20 0729 03/15/20 0447 03/16/20 0502 03/17/20 0454 03/18/20 0610 03/20/20 0609  WBC  --   --  5.6 5.4 6.2 6.2 8.9  --   CREATININE  --   --  2.13* 2.19* 1.96* 1.81* 1.89* 1.81*  LATICACIDVEN 1.2 1.1  --   --   --   --   --   --     Estimated Creatinine Clearance: 33.5 mL/min (A) (by C-G formula based on SCr of 1.81 mg/dL (H)).    No Known Allergies  Antimicrobials this admission: baricitinib 1/28>>   remdesivir 1/24>>1/25 Unasyn 1/31>>    Dose adjustments this admission: Unasyn, baricitinib  Microbiology results: 1/24  BC x2: NGF 1/24 UCx:  >100K CFU S. agalactiae 1/24 Resp PCR: SARS CoV-2 positive   Thank you for allowing pharmacy to be a part of this patient's care.  Despina Pole 03/20/2020 10:40 AM

## 2020-03-20 NOTE — Progress Notes (Signed)
Patient placed on BIPAP at 100 FIO2 by respiratory. Patient is still breathing 47 times a minute and oxygen saturation is 89. MD notified. Pulmonology consult ordered by MD. MD spoke with family. Family coming to bedside to discuss code status. Will continue to monitor.

## 2020-03-21 ENCOUNTER — Encounter (HOSPITAL_COMMUNITY): Payer: Self-pay | Admitting: Family Medicine

## 2020-03-21 DIAGNOSIS — U071 COVID-19: Secondary | ICD-10-CM | POA: Diagnosis not present

## 2020-03-21 DIAGNOSIS — Z7189 Other specified counseling: Secondary | ICD-10-CM | POA: Diagnosis not present

## 2020-03-21 DIAGNOSIS — Z515 Encounter for palliative care: Secondary | ICD-10-CM

## 2020-03-21 DIAGNOSIS — J9601 Acute respiratory failure with hypoxia: Secondary | ICD-10-CM | POA: Diagnosis not present

## 2020-03-21 DIAGNOSIS — J1282 Pneumonia due to coronavirus disease 2019: Secondary | ICD-10-CM | POA: Diagnosis not present

## 2020-03-21 LAB — BASIC METABOLIC PANEL
Anion gap: 12 (ref 5–15)
BUN: 79 mg/dL — ABNORMAL HIGH (ref 8–23)
CO2: 18 mmol/L — ABNORMAL LOW (ref 22–32)
Calcium: 8.5 mg/dL — ABNORMAL LOW (ref 8.9–10.3)
Chloride: 118 mmol/L — ABNORMAL HIGH (ref 98–111)
Creatinine, Ser: 3.91 mg/dL — ABNORMAL HIGH (ref 0.61–1.24)
GFR, Estimated: 14 mL/min — ABNORMAL LOW (ref >60)
Glucose, Bld: 85 mg/dL (ref 70–99)
Potassium: 5.2 mmol/L — ABNORMAL HIGH (ref 3.5–5.1)
Sodium: 148 mmol/L — ABNORMAL HIGH (ref 135–145)

## 2020-03-21 LAB — CBC
HCT: 48.8 % (ref 39.0–52.0)
Hemoglobin: 14.2 g/dL (ref 13.0–17.0)
MCH: 28 pg (ref 26.0–34.0)
MCHC: 29.1 g/dL — ABNORMAL LOW (ref 30.0–36.0)
MCV: 96.1 fL (ref 80.0–100.0)
Platelets: 297 10*3/uL (ref 150–400)
RBC: 5.08 MIL/uL (ref 4.22–5.81)
RDW: 15.7 % — ABNORMAL HIGH (ref 11.5–15.5)
WBC: 9.6 10*3/uL (ref 4.0–10.5)
nRBC: 0.5 % — ABNORMAL HIGH (ref 0.0–0.2)

## 2020-03-21 LAB — GLUCOSE, CAPILLARY
Glucose-Capillary: 100 mg/dL — ABNORMAL HIGH (ref 70–99)
Glucose-Capillary: 101 mg/dL — ABNORMAL HIGH (ref 70–99)
Glucose-Capillary: 76 mg/dL (ref 70–99)
Glucose-Capillary: 84 mg/dL (ref 70–99)
Glucose-Capillary: 93 mg/dL (ref 70–99)

## 2020-03-21 LAB — PROCALCITONIN: Procalcitonin: 23.2 ng/mL

## 2020-03-21 MED ORDER — SODIUM CHLORIDE 0.9 % IV SOLN
3.0000 g | Freq: Two times a day (BID) | INTRAVENOUS | Status: DC
  Administered 2020-03-22 (×2): 3 g via INTRAVENOUS
  Filled 2020-03-21 (×4): qty 8

## 2020-03-21 MED ORDER — ENOXAPARIN SODIUM 60 MG/0.6ML ~~LOC~~ SOLN: 50.0000 mg | SUBCUTANEOUS | Status: DC

## 2020-03-21 MED ORDER — NOREPINEPHRINE 4 MG/250ML-% IV SOLN
2.0000 ug/min | INTRAVENOUS | Status: DC
  Filled 2020-03-21: qty 250

## 2020-03-21 MED ORDER — BARICITINIB 1 MG PO TABS
1.0000 mg | ORAL_TABLET | Freq: Every day | ORAL | Status: DC
  Filled 2020-03-21 (×3): qty 1

## 2020-03-21 MED ORDER — ENOXAPARIN SODIUM 30 MG/0.3ML ~~LOC~~ SOLN
30.0000 mg | SUBCUTANEOUS | Status: DC
  Administered 2020-03-21: 30 mg via SUBCUTANEOUS
  Filled 2020-03-21: qty 0.3

## 2020-03-21 MED ORDER — SODIUM CHLORIDE 0.9 % IV SOLN: 250.0000 mL | INTRAVENOUS | Status: DC

## 2020-03-21 MED ORDER — SODIUM CHLORIDE 0.9 % IV BOLUS
250.0000 mL | Freq: Once | INTRAVENOUS | Status: AC
  Administered 2020-03-21: 250 mL via INTRAVENOUS

## 2020-03-21 NOTE — Progress Notes (Signed)
PROGRESS NOTE   Scott Harvey  MCN:[id number] DOB: 1928/09/06 DOA: 03/20/2020 PCP: Asencion Noble, MD   Chief Complaint  Patient presents with  . Fall   Level of care: Stepdown  Brief Admission History:  87 y.o. male with medical history significant of osteoarthritis, bladder cancer, stage III CKD, dry skin, history of GI bleed due to aspirin use, history of renal cell carcinoma with left nephrectomy, impaired hearing, macular degeneration of both eyes, right renal cyst, history of herpes zoster who is coming to the emergency department via EMS after family last saw him on Saturday, but were concerned today since the patient had not been answering the phone and was subsequently found altered on the bathroom floor.  EMS described his oxygen saturation was 84% on room air and placed on supplemental oxygen.   -Patient initially improved but then respiratory status decompensated around 03/19/2020, ??  Aspiration related, Unasyn added, becoming BiPAP dependent -Patient is now DNR/DNI  Assessment & Plan:   Principal Problem:   Pneumonia due to COVID-19 virus Active Problems:   Class 2 obesity   Acute renal failure superimposed on stage 3b chronic kidney disease (HCC)   Proteinuria   Hypoalbuminemia   Acute respiratory failure with hypoxia (HCC)   1)Acute respiratory failure with hypoxia secondary to Covid pneumonia - Continue bronchodilators and supportive measures.  -Continue baricitinib, prednisone,  zinc and vitamin C -Patient completed IV remdesivir on 03/17/2020 -Repeat serial chest x-ray on 03/19/2020 and 03/20/20 consistent with already known Covid pneumonia -Plan to repeat chest x-ray on 03/22/2020  03/21/20 Significant respiratory decompensation overnight with increased work of breathing on increased oxygen requirement--after further discussion with patient's son and patient's daughter--declined intubation request trial of BiPAP -D-dimer noted, lower extremity venous Dopplers negative  for acute DVT -PCT is trending up-- -Concerns for possible Aspiration pneumonia so IV Unasyn started on 03/20/20  COVID-19 Labs  Recent Labs    03/20/20 1203  DDIMER 3.83*    Lab Results  Component Value Date   SARSCOV2NAA POSITIVE (A) 03/04/2020   [place] Not Detected 12/23/2018    2)AKI on CKD stage 3b -creatinine is now up to 3.91 from 1.8-suspect related to dehydration and transient hypotension -Continue IV fluids -Unable to give bicarb tablets due to BiPAP use - renally adjust medications, avoid nephrotoxic agents / dehydration  / hypotension --Patient is status post prior nephrectomy and has only one kidney  3) social/ethics--- plan of care and CODE STATUS discussed with patient's son and patient's daughter patient is a DNR/DNI -They would like try BiPAP and continue current management otherwise -Overall prognosis is poor -Palliative care consult appreciated, family request that we will treat the "treatable"  4)BPH--c/n  Flomax and Proscar--when oral intake resume  5) possible aspiration pneumonia--IV Unasyn as above #1, PCT trending up, repeat chest x-ray on 03/22/2020  6) rheumatoid arthritis--- continue to hold hydroxychloroquine while on steroids, no rheumatoid arthritis flareup at this time consider restarting  after completing steroid therapy for Covid  7) postherpetic neuralgia--- continue gabapentin at 200 mg twice daily-when oral intake resumes  8) hypernatremia--sodium is up to 148 due to dehydration with  free water deficit in the setting of decreased oral intake associated with continuous BiPAP use -D5 water as ordered until oral intake is more reliable  9) elevated LFTs---  ALT and AST elevated, but largely stable at this time ---Bili and alk phos is not elevated --LFTs have been elevated but rather stable overall--patient already completed remdesivir -Patient does not appear to  be on any medications notorious for being hepatotoxic  DVT prophylaxis:  enoxaparin Code Status: full  Family Communication: Discussed with daughter Levada Dy  And son o  Disposition: TBD Status is: Inpatient  Remains inpatient appropriate because:IV treatments appropriate due to intensity of illness or inability to take PO and Inpatient level of care appropriate due to severity of illness   Dispo: The patient is from: Home              Anticipated d/c is to: Critically ill                Anticipated d/c date is: > 3 days              Patient currently is medically stable to d/c.   Difficult to place patient No --   Consultants:   palliative  Procedures:   bipap  Antimicrobials:   Unasyn 03/20/20  Subjective:  -Significant respiratory decompensation overnight now on BiPAP with   --Overall prognosis is poor   Objective: Vitals:   03/21/20 0835 03/21/20 1243 03/21/20 1258 03/21/20 1704  BP:      Pulse: 100  (!) 106   Resp:   (!) 42   Temp:  98 F (36.7 C)  99 F (37.2 C)  TempSrc:  Axillary  Oral  SpO2: 96%  95%   Weight:      Height:        Intake/Output Summary (Last 24 hours) at 03/21/2020 1920 Last data filed at 03/21/2020 1700 Gross per 24 hour  Intake 1556.31 ml  Output 200 ml  Net 1356.31 ml   Filed Weights   03/14/20 1243 03/16/20 0330 03/21/20 0445  Weight: 107.1 kg 107.8 kg 106 kg    Examination:  General exam: frail elderly male, increased work of breathing Nose-BiPAP Respiratory system: Diminished breath sounds, rhonchorous Cardiovascular system: normal S1 & S2 heard. No JVD, murmurs, rubs,  Gastrointestinal system: Abdomen is nondistended, soft and nontender.  . Normal bowel sounds heard. Central nervous system: Mostly unresponsive at this time  extremities: Good pulses Skin: No rashes, lesions or ulcers Psychiatry: -Mostly unresponsive at this time   Data Reviewed: I have personally reviewed following labs and imaging studies  CBC: Recent Labs  Lab 03/15/20 0447 03/16/20 0502 03/17/20 0454 03/18/20 0610  03/20/20 1203 03/21/20 0552  WBC 5.4 6.2 6.2 8.9 6.8 9.6  NEUTROABS 4.8 5.6 5.5 7.2  --   --   HGB 12.4* 13.4 12.8* 14.6 14.6 14.2  HCT 41.3 44.0 43.2 48.7 48.7 48.8  MCV 93.0 92.6 94.1 93.1 93.8 96.1  PLT 189 250 248 305 399 742    Basic Metabolic Panel: Recent Labs  Lab 03/15/20 0447 03/16/20 0502 03/17/20 0454 03/18/20 0610 03/20/20 0609 03/21/20 0552  NA 143 142 143 146* 146* 148*  K 3.9 3.9 4.5 4.3 5.1 5.2*  CL 113* 113* 116* 116* 115* 118*  CO2 20* 20* 22 23 20* 18*  GLUCOSE 129* 136* 125* 83 113* 85  BUN 63* 64* 62* 65* 59* 79*  CREATININE 2.19* 1.96* 1.81* 1.89* 1.81* 3.91*  CALCIUM 8.2* 8.4* 8.6* 8.9 9.0 8.5*  MG 2.0  --   --   --   --   --   PHOS 4.2  --   --   --   --   --     GFR: Estimated Creatinine Clearance: 15.4 mL/min (A) (by C-G formula based on SCr of 3.91 mg/dL (H)).  Liver Function Tests: Recent Labs  Lab  03/15/20 0447 03/16/20 0502 03/17/20 0454 03/18/20 0610 03/20/20 0609  AST 66* 57* 68* 93* 50*  ALT 47* 47* 56* 95* 90*  ALKPHOS 65 70 65 73 78  BILITOT 0.5 0.5 0.6 0.6 0.8  PROT 5.7* 6.1* 5.8* 6.4* 6.6  ALBUMIN 2.4* 2.6* 2.5* 2.7* 2.8*    CBG: Recent Labs  Lab 03/20/20 2334 03/21/20 0606 03/21/20 0739 03/21/20 1139 03/21/20 1635  GLUCAP 75 76 101* 84 100*    Recent Results (from the past 240 hour(s))  Urine culture     Status: Abnormal   Collection Time: 03/14/2020  9:14 PM   Specimen: Urine, Clean Catch  Result Value Ref Range Status   Specimen Description   Final    URINE, CLEAN CATCH Performed at [facility], [address], [place], [place] [address]    Special Requests   Final    NONE Performed at [facility], [address] [place], [place] [address]    Culture (A)  Final    >=100,000 COLONIES/mL GROUP B STREP(S.AGALACTIAE)ISOLATED TESTING AGAINST S. AGALACTIAE NOT ROUTINELY PERFORMED DUE TO PREDICTABILITY OF AMP/PEN/VAN SUSCEPTIBILITY. Performed at [facility], [place] [address] [place], [place]  [address]    Report Status 03/16/2020 FINAL  Final  SARS Coronavirus 2 by RT PCR (hospital order, performed in [facility] hospital lab) Nasopharyngeal Nasopharyngeal Swab     Status: Abnormal   Collection Time: 03/06/2020  9:15 PM   Specimen: Nasopharyngeal Swab  Result Value Ref Range Status   SARS Coronavirus 2 POSITIVE (A) NEGATIVE Final    Comment: RESULT CALLED TO, READ BACK BY AND VERIFIED WITH: R THOMPSON,RN'@2304'  02/19/2020 MKELLY (NOTE) SARS-CoV-2 target nucleic acids are DETECTED  SARS-CoV-2 RNA is generally detectable in upper respiratory specimens  during the acute phase of infection.  Positive results are indicative  of the presence of the identified virus, but do not rule out bacterial infection or co-infection with other pathogens not detected by the test.  Clinical correlation with patient history and  other diagnostic information is necessary to determine patient infection status.  The expected result is negative.  Fact Sheet for Patients:   StrictlyIdeas.no   Fact Sheet for Healthcare Providers:   BankingDealers.co.za    This test is not yet approved or cleared by the [place] FDA and  has been authorized for detection and/or diagnosis of SARS-CoV-2 by FDA under an Emergency Use Authorization (EUA).  This EUA will remain in effect (meaning this te st can be used) for the duration of  the COVID-19 declaration under Section 564(b)(1) of the Act, 21 U.S.C. section 360-bbb-3(b)(1), unless the authorization is terminated or revoked sooner.  Performed at [facility], [address] [place], [place] [address]   Blood Culture (routine x 2)     Status: None   Collection Time: 03/15/2020 11:42 PM   Specimen: BLOOD  Result Value Ref Range Status   Specimen Description BLOOD LEFT ANTECUBITAL  Final   Special Requests   Final    BOTTLES DRAWN AEROBIC AND ANAEROBIC Blood Culture adequate volume   Culture   Final    NO GROWTH 5  DAYS Performed at [facility], [address] [place], [place] [address]    Report Status 03/18/2020 FINAL  Final  Blood Culture (routine x 2)     Status: None   Collection Time: 03/08/2020 11:42 PM   Specimen: BLOOD RIGHT HAND  Result Value Ref Range Status   Specimen Description BLOOD RIGHT HAND  Final  Special Requests   Final    BOTTLES DRAWN AEROBIC AND ANAEROBIC Blood Culture adequate volume   Culture   Final    NO GROWTH 5 DAYS Performed at [facility], [address] [place], [place] [address]    Report Status 03/18/2020 FINAL  Final  MRSA PCR Screening     Status: None   Collection Time: 03/14/20 12:48 PM   Specimen: Nasal Mucosa; Nasopharyngeal  Result Value Ref Range Status   MRSA by PCR NEGATIVE NEGATIVE Final    Comment:        The GeneXpert MRSA Assay (FDA approved for NASAL specimens only), is one component of a comprehensive MRSA colonization surveillance program. It is not intended to diagnose MRSA infection nor to guide or monitor treatment for MRSA infections. Performed at [facility], [address], [place], [place] [address]   Culture, blood (Routine X 2) w Reflex to ID Panel     Status: None (Preliminary result)   Collection Time: 03/21/20  9:11 AM   Specimen: BLOOD RIGHT HAND  Result Value Ref Range Status   Specimen Description BLOOD RIGHT HAND  Final   Special Requests   Final    BOTTLES DRAWN AEROBIC AND ANAEROBIC Blood Culture adequate volume Performed at [facility], [address], [place], [place] [address]    Culture PENDING  Incomplete   Report Status PENDING  Incomplete  Culture, blood (Routine X 2) w Reflex to ID Panel     Status: None (Preliminary result)   Collection Time: 03/21/20  9:11 AM   Specimen: BLOOD  Result Value Ref Range Status   Specimen Description BLOOD RIGHT ANTECUBITAL  Final   Special Requests   Final    BOTTLES DRAWN AEROBIC AND ANAEROBIC Blood Culture adequate volume Performed at [facility], [address], [place], [place] [address]    Culture PENDING  Incomplete   Report Status PENDING  Incomplete    Radiology Studies: US Venous Img Lower Bilateral (DVT)  Result Date: 03/20/2020 CLINICAL DATA:  Respiratory failure secondary to COVID-19 pneumonia. Elevated D-dimer. EXAM: BILATERAL LOWER EXTREMITY VENOUS DOPPLER ULTRASOUND TECHNIQUE: Gray-scale sonography with graded compression, as well as color Doppler and duplex ultrasound were performed to evaluate the lower extremity deep venous systems from the level of the common femoral vein and including the common femoral, femoral, profunda femoral, popliteal and calf veins including the posterior tibial, peroneal and gastrocnemius veins when visible. The superficial great saphenous vein was also interrogated. Spectral Doppler was utilized to evaluate flow at rest and with distal augmentation maneuvers in the common femoral, femoral and popliteal veins. COMPARISON:  None. FINDINGS: RIGHT LOWER EXTREMITY Common Femoral Vein: No evidence of thrombus. Normal compressibility, respiratory phasicity and response to augmentation. Saphenofemoral Junction: No evidence of thrombus. Normal compressibility and flow on color Doppler imaging. Profunda Femoral Vein: No evidence of thrombus. Normal compressibility and flow on color Doppler imaging. Femoral Vein: No evidence of thrombus. Normal compressibility, respiratory phasicity and response to augmentation. Popliteal Vein: No evidence of thrombus. Normal compressibility, respiratory phasicity and response to augmentation. Calf Veins: No evidence of thrombus. Normal compressibility and flow on color Doppler imaging. Superficial Great Saphenous Vein: No evidence of thrombus. Normal compressibility. Venous Reflux:  None. Other Findings: No evidence of superficial thrombophlebitis or abnormal fluid collection. LEFT LOWER EXTREMITY Common Femoral Vein: No evidence of thrombus. Normal compressibility, respiratory phasicity and response  to augmentation. Saphenofemoral Junction: No evidence of thrombus. Normal compressibility and flow on color Doppler imaging. Profunda Femoral  Vein: No evidence of thrombus. Normal compressibility and flow on color Doppler imaging. Femoral Vein: No evidence of thrombus. Normal compressibility, respiratory phasicity and response to augmentation. Popliteal Vein: No evidence of thrombus. Normal compressibility, respiratory phasicity and response to augmentation. Calf Veins: No evidence of thrombus. Normal compressibility and flow on color Doppler imaging. Superficial Great Saphenous Vein: No evidence of thrombus. Normal compressibility. Venous Reflux:  None. Other Findings: No evidence of superficial thrombophlebitis or abnormal fluid collection. IMPRESSION: No evidence of deep venous thrombosis in either lower extremity. Electronically Signed   By: Aletta Edouard M.D.   On: 03/20/2020 15:53   DG CHEST PORT 1 VIEW  Result Date: 03/20/2020 CLINICAL DATA:  Cough, dyspnea, COVID-19 positive EXAM: PORTABLE CHEST 1 VIEW COMPARISON:  03/19/2020 FINDINGS: Stable heart size. Prominence of the aortic knob suspicious for underlying thoracic aortic aneurysm. Streaky interstitial opacities bilaterally are similar to prior and compatible with multifocal viral pneumonia in the setting of COVID 19 infection. No appreciable pleural fluid collection. No pneumothorax. IMPRESSION: 1. Persistent streaky interstitial opacities bilaterally compatible with multifocal viral pneumonia in the setting of COVID-19 infection. 2. Prominence of the aortic knob suspicious for underlying thoracic aortic aneurysm. Electronically Signed   By: Davina Poke D.O.   On: 03/20/2020 11:52    Scheduled Meds: . vitamin C  500 mg Oral Daily  . [START ON 03/22/2020] baricitinib  1 mg Oral Daily  . Chlorhexidine Gluconate Cloth  6 each Topical Daily  . enoxaparin (LOVENOX) injection  30 mg Subcutaneous Q24H  . finasteride  5 mg Oral Daily  .  gabapentin  200 mg Oral BID  . guaiFENesin  600 mg Oral BID  . predniSONE  50 mg Oral Q breakfast  . sodium bicarbonate  650 mg Oral BID  . tamsulosin  0.4 mg Oral QPC supper  . zinc sulfate  220 mg Oral Daily   Continuous Infusions: . sodium chloride Stopped (03/18/20 1102)  . sodium chloride 10 mL/hr at 03/21/20 0300  . [START ON 03/22/2020] ampicillin-sulbactam (UNASYN) IV    . dextrose 125 mL/hr at 03/21/20 1017  . norepinephrine (LEVOPHED) Adult infusion      LOS: 7 days   Roxan Hockey, MD How to contact the [facility] Attending or Consulting provider [place] or covering provider during after hours [place], for this patient?  1. Check the care team in [facility] and look for a) attending/consulting [facility] provider listed and b) the [facility] team listed 2. Log into [email or web address] and use [place]'s universal password to access. If you do not have the password, please contact the hospital operator. 3. Locate the [facility] provider you are looking for under [facility] and page to a number that you can be directly reached. 4. If you still have difficulty reaching the provider, please page the [facility] (Director on Call) for the Hospitalists listed on amion for assistance.  03/21/2020, 7:20 PM

## 2020-03-21 NOTE — Progress Notes (Signed)
9:24PM RN called due to patient's SBP persistently within 69-72, IV NS 500 mL bolus was given with transitory improvement in SBP into the high 80s.  SBP started trending down into the 70s after the bolus was completed.  In order to avoid too much fluid in Covid pneumonia, patient will be started on peripheral IV pressor (Levophed) to maintain SBP > 65.  IV NS 250 mL x 1 will be given at this time. We shall continue to monitor patient and treat accordingly.

## 2020-03-21 NOTE — Consult Note (Addendum)
Consultation Note Date: 03/21/2020   Patient Name: Scott Harvey  DOB: February 05, 1929  MRN: [id number]  Age / Sex: 87 y.o., male  PCP: Asencion Noble, MD Referring Physician: Roxan Hockey, MD  Reason for Consultation: Establishing goals of care and Psychosocial/spiritual support  HPI/Patient Profile: 87 y.o. male  with past medical history of bladder cancer, renal cell carcinoma with left nephrectomy, CKD 3, osteoarthritis, GI bleed due to ASA, bilateral macular degeneration, impaired hearing, history of herpes zoster, obesity admitted on 03/12/2020 with acute Covid pneumonia, with possible aspiration pneumonia 03/20/2020.   Clinical Assessment and Goals of Care: I have reviewed medical records including EPIC notes, labs and imaging, received report from bedside nursing staff and attending, examined the patient.  Mr. Runco is lying quietly in bed with BiPAP in place.  He appears acutely/chronically ill.  He does not respond to voice or touch.  He will not open his eyes.  There is no family at bedside at this time.  Call to daughter, Bernis Schreur to discuss diagnosis prognosis, [place], EOL wishes, disposition and options.son Lyndol Vanderheiden is also on the phone call.  I introduced Palliative Medicine as specialized medical care for people living with serious illness. It focuses on providing relief from the symptoms and stress of a serious illness.   We discussed a brief life review of the patient.  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  His wife passed away a little over a year ago from recurrent lung cancer.  He has 2 children, Tim and Levada Dy.  We discussed current illness and what it means in the larger context of on-going co-morbidities.  Natural disease trajectory and expectations at EOL were discussed.  We talked about Mr. Burnham acute health concerns including, but not limited to, BiPAP use, oxygen settings, respiratory  rate and effort, selected labs, the treatment plan, Levophed for blood pressure support.  I attempted to elicit values and goals of care important to the patient.  We talked about time for "meaningful improvements" and time for outcomes.   The difference between aggressive medical intervention and comfort care was considered in light of the patient's goals of care.  Levada Dy tearfully states her experience with her mother passing away a little over a year ago with recurrent cancer.  She tearfully states that they do not want Mr. Granquist to suffer.  Questions and concerns were addressed.  The family was encouraged to call with questions or concerns.   Conference with attending and bedside nursing staff related to patient condition, needs, goals of care. PMT to follow.  HCPOA    HCPOA -sonTim and daughter Rayshaun Needle share healthcare power of attorney.  They make choices as a team.  Mr. Christopoulos wife died a little over 1 year ago.    SUMMARY OF RECOMMENDATIONS   Continue to treat the treatable but no CPR or intubation. Time for outcomes.  Code Status/Advance Care Planning:  DNR  Symptom Management:   Per hospitalist, no additional needs at this time.  Palliative Prophylaxis:   Frequent Pain  Assessment, Oral Care and Turn Reposition  Additional Recommendations (Limitations, Scope, Preferences):  Treat the treatable but no CPR or intubation.  Psycho-social/Spiritual:   Desire for further Chaplaincy support:no  Additional Recommendations: Caregiving  Support/Resources and Education on Hospice  Prognosis:   Unable to determine, based on outcomes.  Guarded at this point.  Discharge Planning: To be determined, based on outcomes.      Primary Diagnoses: Present on Admission: . Pneumonia due to COVID-19 virus . Class 2 obesity . Acute renal failure superimposed on stage 3b chronic kidney disease ([place]) . Proteinuria . Hypoalbuminemia . Acute respiratory failure with  hypoxia ([place])   I have reviewed the medical record, interviewed the patient and family, and examined the patient. The following aspects are pertinent.  Past Medical History:  Diagnosis Date  . Arthritis   . Bladder cancer ([facility])   . CKD (chronic kidney disease), stage III ([facility])   . Dry skin    generalized itching ?d/t kidney disease  . H/O: GI bleed    d/t ASA  . History of renal cell carcinoma    1980's left nephrectomy  . HOH (hard of hearing)    right ear better than left  . Macular degeneration of both eyes   . Renal cyst, right   . Shingles 03/11/2019  . Solitary right kidney    acquired-- s/p left nephrectomy 1980's   Social History   Socioeconomic History  . Marital status: Married    Spouse name: Not on file  . Number of children: Not on file  . Years of education: Not on file  . Highest education level: Not on file  Occupational History  . Not on file  Tobacco Use  . Smoking status: Former Smoker    Types: Pipe    Quit date: 12/26/2005    Years since quitting: 14.2  . Smokeless tobacco: Never Used  Substance and Sexual Activity  . Alcohol use: No  . Drug use: No  . Sexual activity: Yes  Other Topics Concern  . Not on file  Social History Narrative  . Not on file   Social Determinants of Health   Financial Resource Strain: Not on file  Food Insecurity: Not on file  Transportation Needs: Not on file  Physical Activity: Not on file  Stress: Not on file  Social Connections: Not on file   Family History  Problem Relation Age of Onset  . Hypertension Other    Scheduled Meds: . albuterol  2 puff Inhalation QID  . vitamin C  500 mg Oral Daily  . [START ON 03/22/2020] baricitinib  1 mg Oral Daily  . Chlorhexidine Gluconate Cloth  6 each Topical Daily  . enoxaparin (LOVENOX) injection  55 mg Subcutaneous Q24H  . finasteride  5 mg Oral Daily  . gabapentin  200 mg Oral BID  . guaiFENesin  600 mg Oral BID  . predniSONE  50 mg Oral Q breakfast  . sodium  bicarbonate  650 mg Oral BID  . tamsulosin  0.4 mg Oral QPC supper  . zinc sulfate  220 mg Oral Daily   Continuous Infusions: . sodium chloride Stopped (03/18/20 1102)  . sodium chloride 10 mL/hr at 03/21/20 0300  . ampicillin-sulbactam (UNASYN) IV Stopped (03/21/20 0558)  . dextrose 125 mL/hr at 03/21/20 1017  . norepinephrine (LEVOPHED) Adult infusion     PRN Meds:.acetaminophen **OR** acetaminophen, chlorpheniramine-HYDROcodone, guaiFENesin-dextromethorphan, haloperidol lactate, hydrALAZINE, labetalol, LORazepam, morphine injection, prochlorperazine Medications Prior to Admission:  Prior to Admission medications  Medication Sig Start Date End Date Taking? Authorizing Provider  acetaminophen (TYLENOL) 325 MG tablet Take 650 mg by mouth every 6 (six) hours as needed for mild pain.    Yes [provider]  finasteride (PROSCAR) 5 MG tablet Take 1 tablet (5 mg total) by mouth daily. 10/13/19  Yes McKenzie, Candee Furbish, MD  gabapentin (NEURONTIN) 300 MG capsule Take 300 mg by mouth 3 (three) times daily. 02/26/20  Yes [provider]  hydrocortisone 2.5 % lotion Apply 1 application topically daily as needed. 06/10/19  Yes [provider]  hydroxychloroquine (PLAQUENIL) 200 MG tablet Take 400 mg by mouth daily. 07/08/19  Yes [provider]  loratadine (CLARITIN) 10 MG tablet Take 10 mg by mouth daily.   Yes [provider]  sodium bicarbonate 650 MG tablet 650 mg 3 (three) times daily. 06/10/19  Yes [provider]  tamsulosin (FLOMAX) 0.4 MG CAPS capsule TAKE 1 CAPSULE(0.4 MG) BY MOUTH DAILY 01/18/20  Yes McKenzie, Candee Furbish, MD   No Known Allergies Review of Systems  Unable to perform ROS: Acuity of condition    Physical Exam Vitals and nursing note reviewed.  HENT:     Head: Normocephalic and atraumatic.     Mouth/Throat:     Mouth: Mucous membranes are dry.  Cardiovascular:     Rate and Rhythm: Normal rate.  Pulmonary:      Comments: BiPAP in place Abdominal:     Palpations: Abdomen is soft.     Tenderness: There is no guarding.  Skin:    General: Skin is warm and dry.  Neurological:     Comments: Does not respond to voice or touch     Vital Signs: BP 130/75   Pulse 100   Temp 98.5 F (36.9 C) (Axillary)   Resp (!) 28   Ht 5\' 9"  (1.753 m)   Wt 106 kg   SpO2 96%   BMI 34.51 kg/m  Pain Scale: CPOT   Pain Score: 0-No pain   SpO2: SpO2: 96 % O2 Device:SpO2: 96 % O2 Flow Rate: .O2 Flow Rate (L/min): 15 L/min  IO: Intake/output summary:   Intake/Output Summary (Last 24 hours) at 03/21/2020 1103 Last data filed at 03/21/2020 6761 Gross per 24 hour  Intake 1556.31 ml  Output --  Net 1556.31 ml    LBM: Last BM Date: 03/14/20 Baseline Weight: Weight: 116.1 kg Most recent weight: Weight: 106 kg     Palliative Assessment/Data:   Flowsheet Rows   Flowsheet Row Most Recent Value  Intake Tab   Referral Department Hospitalist  Unit at Time of Referral Intermediate Care Unit  Palliative Care Primary Diagnosis Pulmonary  Date Notified 03/20/20  Palliative Care Type New Palliative care  Reason for referral Clarify Goals of Care  Date of Admission 03/10/2020  Date first seen by Palliative Care 03/21/20  # of days Palliative referral response time 1 Day(s)  # of days IP prior to Palliative referral 7  Clinical Assessment   Palliative Performance Scale Score 20%  Pain Max last 24 hours Not able to report  Pain Min Last 24 hours Not able to report  Dyspnea Max Last 24 Hours Not able to report  Dyspnea Min Last 24 hours Not able to report  Psychosocial & Spiritual Assessment   Palliative Care Outcomes       Time In: 0940 Time Out: 1050 Time Total: 70 minutes Greater than 50%  of this time was spent counseling and coordinating care related to the  above assessment and plan.  Signed by: Drue Novel, NP   Please contact Palliative Medicine Team phone at [phone] for questions and concerns.   For individual provider: See Shea Evans

## 2020-03-21 DEATH — deceased

## 2020-03-22 ENCOUNTER — Inpatient Hospital Stay (HOSPITAL_COMMUNITY): Payer: Medicare HMO

## 2020-03-22 DIAGNOSIS — U071 COVID-19: Secondary | ICD-10-CM | POA: Diagnosis not present

## 2020-03-22 DIAGNOSIS — Z515 Encounter for palliative care: Secondary | ICD-10-CM | POA: Diagnosis not present

## 2020-03-22 DIAGNOSIS — Z7189 Other specified counseling: Secondary | ICD-10-CM | POA: Diagnosis not present

## 2020-03-22 DIAGNOSIS — J9601 Acute respiratory failure with hypoxia: Secondary | ICD-10-CM | POA: Diagnosis not present

## 2020-03-22 LAB — BASIC METABOLIC PANEL
Anion gap: 11 (ref 5–15)
BUN: 96 mg/dL — ABNORMAL HIGH (ref 8–23)
CO2: 21 mmol/L — ABNORMAL LOW (ref 22–32)
Calcium: 8.7 mg/dL — ABNORMAL LOW (ref 8.9–10.3)
Chloride: 112 mmol/L — ABNORMAL HIGH (ref 98–111)
Creatinine, Ser: 4.51 mg/dL — ABNORMAL HIGH (ref 0.61–1.24)
GFR, Estimated: 12 mL/min — ABNORMAL LOW (ref >60)
Glucose, Bld: 95 mg/dL (ref 70–99)
Potassium: 5.3 mmol/L — ABNORMAL HIGH (ref 3.5–5.1)
Sodium: 144 mmol/L (ref 135–145)

## 2020-03-22 LAB — CBC
HCT: 46.8 % (ref 39.0–52.0)
Hemoglobin: 13.8 g/dL (ref 13.0–17.0)
MCH: 28.2 pg (ref 26.0–34.0)
MCHC: 29.5 g/dL — ABNORMAL LOW (ref 30.0–36.0)
MCV: 95.5 fL (ref 80.0–100.0)
Platelets: 297 10*3/uL (ref 150–400)
RBC: 4.9 MIL/uL (ref 4.22–5.81)
RDW: 15.9 % — ABNORMAL HIGH (ref 11.5–15.5)
WBC: 14.7 10*3/uL — ABNORMAL HIGH (ref 4.0–10.5)
nRBC: 0.2 % (ref 0.0–0.2)

## 2020-03-22 LAB — URINALYSIS, ROUTINE W REFLEX MICROSCOPIC
Bilirubin Urine: NEGATIVE
Glucose, UA: NEGATIVE mg/dL
Ketones, ur: NEGATIVE mg/dL
Nitrite: NEGATIVE
Protein, ur: 100 mg/dL — AB
Specific Gravity, Urine: 1.013 (ref 1.005–1.030)
WBC, UA: >50 WBC/hpf — ABNORMAL HIGH (ref 0–5)
pH: 8 (ref 5.0–8.0)

## 2020-03-22 LAB — GLUCOSE, CAPILLARY
Glucose-Capillary: 79 mg/dL (ref 70–99)
Glucose-Capillary: 95 mg/dL (ref 70–99)

## 2020-03-22 LAB — PROCALCITONIN: Procalcitonin: 33.77 ng/mL

## 2020-03-22 MED ORDER — HYDROMORPHONE HCL 1 MG/ML IJ SOLN
1.0000 mg | INTRAMUSCULAR | Status: DC | PRN
  Administered 2020-03-22 – 2020-03-23 (×3): 1 mg via INTRAVENOUS
  Filled 2020-03-22 (×3): qty 1

## 2020-03-22 NOTE — Progress Notes (Signed)
Palliative:  Scott Harvey is lying quietly in bed with nursing staff at bedside.  He appears acutely/chronically ill and very frail.  He does not respond in any meaningful way to voice or touch.  There is no family at bedside at this time Harvey to visitor restrictions.  Conference with attending related to patient worsening condition.  Call to daughter, Scott Harvey.  We talked about Scott Harvey acute health concerns.  We talked about the his condition including, but not limited to respiratory status, BiPAP use, oxygen levels, worsening kidney function, chest x-rays.  Scott Harvey asks about disposition, stating that Scott Harvey would likely not spend a few days in a nursing home before he worsened.  I shared that in order for him to go to a short-term rehab he was to be able to participate in therapies, and he currently cannot.  Scott Harvey states that she is not sure if they are ready for comfort care.  She shares that she needs to talk with her brother.  Plan:    Continue to treat the treatable but no CPR or intubation.  At this point agreeable to continued use of BiPAP.  Time for outcomes.  Considering options.  56 minutes Quinn Axe, NP Palliative medicine team Team phone [phone] Greater than 50% of this time was spent counseling and coordinating care related to the above assessment and plan.

## 2020-03-22 NOTE — Progress Notes (Signed)
PROGRESS NOTE    Scott Harvey  OVF:[id number] DOB: [birth year] DOA: 03/08/2020 PCP: Asencion Noble, MD    Brief Narrative:  87 year old male with a history of bladder cancer, chronic kidney disease stage III, previous history of renal cell carcinoma status post left nephrectomy, was brought to the hospital with altered mental status.  Was found to be significantly hypoxic and was noted to have Covid pneumonia.  He was treated with remdesivir, steroids and baricitinib.  Patient had further decompensation around 1/31 which was felt to be possible aspiration.  He has become BiPAP dependent.  Overall mental status has not had any significant improvements.  Renal function continues to decline.  Palliative care assisting with goals of care and family has elected to pursue comfort measures.   Assessment & Plan:   Principal Problem:   Pneumonia due to COVID-19 virus Active Problems:   Class 2 obesity   Acute renal failure superimposed on stage 3b chronic kidney disease ([facility])   Proteinuria   Hypoalbuminemia   Acute respiratory failure with hypoxia ([facility])   Acute respiratory failure secondary to COVID-19 pneumonia -Patient has completed a course of remdesivir on 1/28 -He has been receiving baricitinib and steroids -Chest x-ray on performed on 2/2 was compared to previous films and it showed progressive infiltrates -On 2/1, patient had declining respiratory status with concerns for underlying aspiration pneumonia.  He was started on intravenous Unasyn -He has been requiring intermittent BiPAP -Despite aggressive measures, overall respiratory status has failed to improve  Acute kidney injury on chronic kidney disease stage IIIb -Creatinine has trended up from 1.8 on admission to 4.5 today. -He was having episodes of hypotension which was felt to be contributing to worsening renal function -He has been receiving IV fluids, but despite aggressive measures his overall renal function continues to  decline -He is status post nephrectomy in the past and has only one kidney  BPH -Chronically on Flomax and finasteride  Rheumatoid arthritis -Chronically on hydroxychloroquine  -No evidence of flare at this time  Hypernatremia -Related to decreased p.o. intake -Improved with IV fluids  Acute metabolic encephalopathy -Possibly related to ongoing issues with hypoxia as well as uremia -At this point, patient is not responsive  Goals of care -Palliative care following to help discuss further goals of care -Patient is currently DNR -I had an honest discussion with the patient's daughter and son -Patient's prior history, current state of multiorgan failure and lack of improvement despite aggressive measures was explained -With his advanced age and multiple medical problems, his long-term prognosis is very poor -Family has elected to pursue comfort measures -We will remove BiPAP, simplify medication regimen to only include medications directly related to the patient's comfort -Family will be allowed to visit patient per comfort care protocol -Anticipate in-hospital death   DVT prophylaxis: Lovenox  Code Status: DNR Family Communication: Discussed with son and daughter over phone Disposition Plan: Status is: Inpatient  Remains inpatient appropriate because:Inpatient level of care appropriate due to severity of illness   Dispo: The patient is from: Home              Anticipated d/c is to: Residential hospice versus in-hospital death              Anticipated d/c date is: 1 day              Patient currently is not medically stable to d/c.   Difficult to place patient No    Consultants:   Palliative care  Procedures:     Antimicrobials:   Unasyn 1/31 >   Subjective: Patient does not answer questions or follow commands  Objective: Vitals:   03/22/20 0450 03/22/20 0834 03/22/20 1035 03/22/20 1500  BP:   129/70 128/75  Pulse:  94 90 70  Resp:  (!) 29 (!) 32 (!)  26  Temp: 98.1 F (36.7 C)   97.6 F (36.4 C)  TempSrc: Axillary   Axillary  SpO2:   (!) 87% 92%  Weight: 107 kg     Height:        Intake/Output Summary (Last 24 hours) at 03/22/2020 1831 Last data filed at 03/22/2020 1826 Gross per 24 hour  Intake 693.75 ml  Output 900 ml  Net -206.25 ml   Filed Weights   03/16/20 0330 03/21/20 0445 03/22/20 0450  Weight: 107.8 kg 106 kg 107 kg    Examination:  General exam: Increased work of breathing, does not follow commands Respiratory system: Bilateral rhonchi.  Increased respiratory effort Cardiovascular system: S1 & S2 heard, RRR. No JVD, murmurs, rubs, gallops or clicks.  Gastrointestinal system: Abdomen is nondistended, soft and nontender. No organomegaly or masses felt. Normal bowel sounds heard. Central nervous system: Limited exam since patient does not follow commands, no facial asymmetry Extremities: 1+ edema b/l Skin: No rashes, lesions or ulcers Psychiatry: Nonverbal    Data Reviewed: I have personally reviewed following labs and imaging studies  CBC: Recent Labs  Lab 03/16/20 0502 03/17/20 0454 03/18/20 0610 03/20/20 1203 03/21/20 0552 03/22/20 0507  WBC 6.2 6.2 8.9 6.8 9.6 14.7*  NEUTROABS 5.6 5.5 7.2  --   --   --   HGB 13.4 12.8* 14.6 14.6 14.2 13.8  HCT 44.0 43.2 48.7 48.7 48.8 46.8  MCV 92.6 94.1 93.1 93.8 96.1 95.5  PLT 250 248 305 399 297 979   Basic Metabolic Panel: Recent Labs  Lab 03/17/20 0454 03/18/20 0610 03/20/20 0609 03/21/20 0552 03/22/20 0507  NA 143 146* 146* 148* 144  K 4.5 4.3 5.1 5.2* 5.3*  CL 116* 116* 115* 118* 112*  CO2 22 23 20* 18* 21*  GLUCOSE 125* 83 113* 85 95  BUN 62* 65* 59* 79* 96*  CREATININE 1.81* 1.89* 1.81* 3.91* 4.51*  CALCIUM 8.6* 8.9 9.0 8.5* 8.7*   GFR: Estimated Creatinine Clearance: 13.4 mL/min (A) (by C-G formula based on SCr of 4.51 mg/dL (H)). Liver Function Tests: Recent Labs  Lab 03/16/20 0502 03/17/20 0454 03/18/20 0610 03/20/20 0609  AST 57*  68* 93* 50*  ALT 47* 56* 95* 90*  ALKPHOS 70 65 73 78  BILITOT 0.5 0.6 0.6 0.8  PROT 6.1* 5.8* 6.4* 6.6  ALBUMIN 2.6* 2.5* 2.7* 2.8*   No results for input(s): LIPASE, AMYLASE in the last 168 hours. No results for input(s): AMMONIA in the last 168 hours. Coagulation Profile: No results for input(s): INR, PROTIME in the last 168 hours. Cardiac Enzymes: No results for input(s): CKTOTAL, CKMB, CKMBINDEX, TROPONINI in the last 168 hours. BNP (last 3 results) No results for input(s): PROBNP in the last 8760 hours. HbA1C: No results for input(s): HGBA1C in the last 72 hours. CBG: Recent Labs  Lab 03/21/20 1139 03/21/20 1635 03/21/20 2337 03/22/20 1150 03/22/20 1822  GLUCAP 84 100* 93 79 95   Lipid Profile: No results for input(s): CHOL, HDL, LDLCALC, TRIG, CHOLHDL, LDLDIRECT in the last 72 hours. Thyroid Function Tests: No results for input(s): TSH, T4TOTAL, FREET4, T3FREE, THYROIDAB in the last 72 hours. Anemia Panel: No results for  input(s): VITAMINB12, FOLATE, FERRITIN, TIBC, IRON, RETICCTPCT in the last 72 hours. Sepsis Labs: Recent Labs  Lab 03/20/20 1203 03/21/20 0552 03/22/20 0507  PROCALCITON 0.56 23.20 33.77    Recent Results (from the past 240 hour(s))  Urine culture     Status: Abnormal   Collection Time: 03/17/2020  9:14 PM   Specimen: Urine, Clean Catch  Result Value Ref Range Status   Specimen Description   Final    URINE, CLEAN CATCH Performed at [facility], [address] [place], [place] [address]    Special Requests   Final    NONE Performed at [facility], [address], [place], [place] [address]    Culture (A)  Final    >=100,000 COLONIES/mL GROUP B STREP(S.AGALACTIAE)ISOLATED TESTING AGAINST S. AGALACTIAE NOT ROUTINELY PERFORMED DUE TO PREDICTABILITY OF AMP/PEN/VAN SUSCEPTIBILITY. Performed at [facility], [place] [address], [place], [place] [address]    Report Status 03/16/2020 FINAL  Final  SARS Coronavirus 2 by RT PCR (hospital  order, performed in [facility] hospital lab) Nasopharyngeal Nasopharyngeal Swab     Status: Abnormal   Collection Time: 03/12/2020  9:15 PM   Specimen: Nasopharyngeal Swab  Result Value Ref Range Status   SARS Coronavirus 2 POSITIVE (A) NEGATIVE Final    Comment: RESULT CALLED TO, READ BACK BY AND VERIFIED WITH: R THOMPSON,RN@[id number]  03/15/2020 MKELLY (NOTE) SARS-CoV-2 target nucleic acids are DETECTED  SARS-CoV-2 RNA is generally detectable in upper respiratory specimens  during the acute phase of infection.  Positive results are indicative  of the presence of the identified virus, but do not rule out bacterial infection or co-infection with other pathogens not detected by the test.  Clinical correlation with patient history and  other diagnostic information is necessary to determine patient infection status.  The expected result is negative.  Fact Sheet for Patients:   StrictlyIdeas.no   Fact Sheet for Healthcare Providers:   BankingDealers.co.za    This test is not yet approved or cleared by the [place] FDA and  has been authorized for detection and/or diagnosis of SARS-CoV-2 by FDA under an Emergency Use Authorization (EUA).  This EUA will remain in effect (meaning this te st can be used) for the duration of  the COVID-19 declaration under Section 564(b)(1) of the Act, 21 U.S.C. section 360-bbb-3(b)(1), unless the authorization is terminated or revoked sooner.  Performed at [facility], [address] [place], [place] [address]   Blood Culture (routine x 2)     Status: None   Collection Time: 02/25/2020 11:42 PM   Specimen: BLOOD  Result Value Ref Range Status   Specimen Description BLOOD LEFT ANTECUBITAL  Final   Special Requests   Final    BOTTLES DRAWN AEROBIC AND ANAEROBIC Blood Culture adequate volume   Culture   Final    NO GROWTH 5 DAYS Performed at [facility], [address] [place], [place] [address]    Report  Status 03/18/2020 FINAL  Final  Blood Culture (routine x 2)     Status: None   Collection Time: 02/19/2020 11:42 PM   Specimen: BLOOD RIGHT HAND  Result Value Ref Range Status   Specimen Description BLOOD RIGHT HAND  Final   Special Requests   Final    BOTTLES DRAWN AEROBIC AND ANAEROBIC Blood Culture adequate volume   Culture   Final    NO GROWTH 5 DAYS Performed at [facility], [address] [place], [place] [address]    Report Status 03/18/2020 FINAL  Final  MRSA PCR Screening     Status: None   Collection Time: 03/14/20 12:48 PM   Specimen: Nasal Mucosa; Nasopharyngeal  Result Value Ref Range Status   MRSA by PCR NEGATIVE NEGATIVE Final    Comment:        The GeneXpert MRSA Assay (FDA approved for NASAL specimens only), is one component of a comprehensive MRSA colonization surveillance program. It is not intended to diagnose MRSA infection nor to guide or monitor treatment for MRSA infections. Performed at [facility], [address] [place], [place] [address]   Culture, blood (Routine X 2) w Reflex to ID Panel     Status: None (Preliminary result)   Collection Time: 03/21/20  9:11 AM   Specimen: BLOOD RIGHT HAND  Result Value Ref Range Status   Specimen Description BLOOD RIGHT HAND  Final   Special Requests   Final    BOTTLES DRAWN AEROBIC AND ANAEROBIC Blood Culture adequate volume   Culture   Final    NO GROWTH < 24 HOURS Performed at [facility], [address], [place], [place] [address]    Report Status PENDING  Incomplete  Culture, blood (Routine X 2) w Reflex to ID Panel     Status: None (Preliminary result)   Collection Time: 03/21/20  9:11 AM   Specimen: BLOOD  Result Value Ref Range Status   Specimen Description BLOOD RIGHT ANTECUBITAL  Final   Special Requests   Final    BOTTLES DRAWN AEROBIC AND ANAEROBIC Blood Culture adequate volume   Culture   Final    NO GROWTH < 24 HOURS Performed at [facility], [address], [place], [place] [address]     Report Status PENDING  Incomplete         Radiology Studies: DG CHEST PORT 1 VIEW  Result Date: 03/22/2020 CLINICAL DATA:  COVID pneumonia.  History of bladder cancer. EXAM: PORTABLE CHEST 1 VIEW COMPARISON:  03/20/2020.  03/19/2020 FINDINGS: Stable cardiomegaly. Thoracic aortic prominence again noted suggesting thoracic aortic aneurysm. No interim change. Diffuse bilateral interstitial infiltrates/edema with interim progression from prior exams. Dense left base atelectasis. Atelectatic changes right upper lobe. Small left pleural effusion again noted. No pneumothorax. IMPRESSION: 1. Stable cardiomegaly. Thoracic aortic prominence again noted suggesting thoracic aortic aneurysm. 2. Diffuse bilateral interstitial infiltrates/edema with interim progression from prior exams. Dense left base atelectasis. Atelectatic changes right upper lobe. Small left pleural effusion again noted. Electronically Signed   By: Marcello Moores  Register   On: 03/22/2020 05:21        Scheduled Meds: . Chlorhexidine Gluconate Cloth  6 each Topical Daily   Continuous Infusions: . sodium chloride Stopped (03/18/20 1102)  . sodium chloride 10 mL/hr at 03/21/20 0300     LOS: 8 days    Time spent: 36mins    Kathie Dike, MD [facility]   If 7PM-7AM, please contact night-coverage [email or web address]  03/22/2020, 6:31 PM

## 2020-03-22 NOTE — Progress Notes (Signed)
PT Cancellation Note  Patient Details Name: Scott Harvey MRN: [id number] DOB: [birth year]   Cancelled Treatment:    Reason Eval/Treat Not Completed: Fatigue/lethargy limiting ability to participate.  Patient unable to respond to commands with verbal and tactile cueing due to lethargy and discharged from physical therapy to care of nursing.  Recommendation:  Reorder physical therapy if patient's status changes and able to participate with functional activity.  Thank you.   1:59 PM, 03/22/20 Lonell Grandchild, MPT Physical Therapist with [facility] [phone] office [phone] mobile phone

## 2020-03-23 DIAGNOSIS — J9601 Acute respiratory failure with hypoxia: Secondary | ICD-10-CM | POA: Diagnosis not present

## 2020-03-23 DIAGNOSIS — Z7189 Other specified counseling: Secondary | ICD-10-CM | POA: Diagnosis not present

## 2020-03-23 DIAGNOSIS — Z515 Encounter for palliative care: Secondary | ICD-10-CM | POA: Diagnosis not present

## 2020-03-23 DIAGNOSIS — I2609 Other pulmonary embolism with acute cor pulmonale: Secondary | ICD-10-CM

## 2020-03-23 DIAGNOSIS — I2782 Chronic pulmonary embolism: Secondary | ICD-10-CM | POA: Diagnosis not present

## 2020-03-23 DIAGNOSIS — N179 Acute kidney failure, unspecified: Secondary | ICD-10-CM | POA: Diagnosis not present

## 2020-03-23 DIAGNOSIS — U071 COVID-19: Secondary | ICD-10-CM | POA: Diagnosis not present

## 2020-03-23 DIAGNOSIS — J1282 Pneumonia due to coronavirus disease 2019: Secondary | ICD-10-CM | POA: Diagnosis not present

## 2020-03-23 LAB — CREATININE, SERUM
Creatinine, Ser: 4.02 mg/dL — ABNORMAL HIGH (ref 0.61–1.24)
GFR, Estimated: 14 mL/min — ABNORMAL LOW (ref >60)

## 2020-03-23 LAB — GLUCOSE, CAPILLARY: Glucose-Capillary: 75 mg/dL (ref 70–99)

## 2020-03-23 MED ORDER — HYDROMORPHONE HCL 1 MG/ML IJ SOLN
1.0000 mg | INTRAMUSCULAR | Status: DC | PRN
  Administered 2020-03-23: 1 mg via INTRAVENOUS
  Filled 2020-03-23: qty 1

## 2020-03-23 MED ORDER — BIOTENE DRY MOUTH MT LIQD: 15.0000 mL | OROMUCOSAL | Status: DC | PRN

## 2020-03-23 MED ORDER — ONDANSETRON HCL 4 MG/2ML IJ SOLN: 4.0000 mg | Freq: Four times a day (QID) | INTRAMUSCULAR | Status: DC | PRN

## 2020-03-23 MED ORDER — GLYCOPYRROLATE 0.2 MG/ML IJ SOLN: 0.2000 mg | INTRAMUSCULAR | Status: DC | PRN

## 2020-03-23 MED ORDER — ONDANSETRON 4 MG PO TBDP: 4.0000 mg | ORAL_TABLET | Freq: Four times a day (QID) | ORAL | Status: DC | PRN

## 2020-03-23 MED ORDER — HALOPERIDOL 0.5 MG PO TABS: 0.5000 mg | ORAL_TABLET | ORAL | Status: DC | PRN

## 2020-03-23 MED ORDER — HALOPERIDOL LACTATE 2 MG/ML PO CONC
0.5000 mg | ORAL | Status: DC | PRN
  Filled 2020-03-23: qty 0.3

## 2020-03-23 MED ORDER — ACETAMINOPHEN 325 MG PO TABS: 650.0000 mg | ORAL_TABLET | Freq: Four times a day (QID) | ORAL | Status: DC | PRN

## 2020-03-23 MED ORDER — ACETAMINOPHEN 650 MG RE SUPP: 650.0000 mg | Freq: Four times a day (QID) | RECTAL | Status: DC | PRN

## 2020-03-23 MED ORDER — GLYCOPYRROLATE 1 MG PO TABS: 1.0000 mg | ORAL_TABLET | ORAL | Status: DC | PRN

## 2020-03-23 MED ORDER — HALOPERIDOL LACTATE 5 MG/ML IJ SOLN: 0.5000 mg | INTRAMUSCULAR | Status: DC | PRN

## 2020-03-23 MED ORDER — LORAZEPAM 2 MG/ML IJ SOLN
0.5000 mg | INTRAMUSCULAR | Status: DC | PRN
  Administered 2020-03-23: 1 mg via INTRAVENOUS
  Filled 2020-03-23: qty 1

## 2020-03-23 MED ORDER — POLYVINYL ALCOHOL 1.4 % OP SOLN: 1.0000 [drp] | Freq: Four times a day (QID) | OPHTHALMIC | Status: DC | PRN

## 2020-03-23 NOTE — Progress Notes (Signed)
Report called and given to [employer], RN on 300. Pt to be transferred via bed to room 333.

## 2020-03-23 NOTE — Progress Notes (Signed)
Palliative: Mr. Rothermel is resting quietly in bed.  His family has elected comfort measures only, and he appears comfortable.   He does not respond in any meaningful way to voice or touch.  There is no family at bedside at this time.  Call to daughter, Naftuli Dalsanto.  We talk about Mr. Neece transitioning to full comfort care.  I share that when I saw him before lunch, he was comfortable.  We talked briefly about symptom management.  I share that Mr. Zukowski has been moved to room 333.  Angie asks about the Covid visitor policy, and I asked her to call in talk with the charge nurse for up-to-date information.  Levada Dy and I talked about hospice care, and the benefits.  I shared that Mr. Bojarski will stay at [facility] under hospice type care if they so desire, but he cannot be moved to the facility because of his Covid positive status.  Levada Dy shares that her mother had hospice care.  At this point Levada Dy shares that it has been somewhat of a "roller coaster" and she declines hospice contact at this point.  I shared that if family changes their mind about services, they can just ask.  Conference with attending, bedside nursing staff and [facility] team related to patient condition, needs, goals of care.  Plan: Full comfort care Prognosis: Hours to days, anticipate hospital death.  53 minutes Quinn Axe, NP Palliative medicine team Team phone [phone] Greater than 50% of this time was spent counseling and coordinating care related to the above assessment and plan.

## 2020-03-23 NOTE — Progress Notes (Signed)
PROGRESS NOTE    Scott Harvey  UUV:[id number] DOB: [birth year] DOA: 02/19/2020 PCP: Asencion Noble, MD    Brief Narrative:  87 year old male with a history of bladder cancer, chronic kidney disease stage III, previous history of renal cell carcinoma status post left nephrectomy, was brought to the hospital with altered mental status.  Was found to be significantly hypoxic and was noted to have Covid pneumonia.  He was treated with remdesivir, steroids and baricitinib.  Patient had further decompensation around 1/31 which was felt to be possible aspiration.  He has become BiPAP dependent.  Overall mental status has not had any significant improvements.  Renal function continues to decline.  Palliative care assisting with goals of care and family has elected to pursue comfort measures.   Assessment & Plan:   Principal Problem:   Pneumonia due to COVID-19 virus Active Problems:   Class 2 obesity   Acute renal failure superimposed on stage 3b chronic kidney disease (HCC)   Proteinuria   Hypoalbuminemia   Acute respiratory failure with hypoxia (HCC)   Acute respiratory failure secondary to COVID-19 pneumonia -Patient has completed a course of remdesivir on 1/28 -He has been receiving baricitinib and steroids -Chest x-ray on performed on 2/2 was compared to previous films and it showed progressive infiltrates -On 2/1, patient had declining respiratory status with concerns for underlying aspiration pneumonia.  He was started on intravenous Unasyn -He had been requiring intermittent BiPAP -Despite aggressive measures, overall respiratory status failed to improve  Acute kidney injury on chronic kidney disease stage IIIb -Creatinine has trended up from 1.8 on admission to 4.5 today. -He was having episodes of hypotension which was felt to be contributing to worsening renal function -He had been receiving IV fluids, but despite aggressive measures his overall renal function continued to  decline -He is status post nephrectomy in the past and has only one kidney  BPH -Chronically on Flomax and finasteride  Rheumatoid arthritis -Chronically on hydroxychloroquine  -No evidence of flare at this time  Hypernatremia -Related to decreased p.o. intake -Improved with IV fluids  Acute metabolic encephalopathy -Possibly related to ongoing issues with hypoxia as well as uremia -At this point, patient is not responsive  Goals of care -Palliative care following to help discuss further goals of care -Patient is currently DNR -I had an honest discussion with the patient's daughter and son -Patient's prior history, current state of multiorgan failure and lack of improvement despite aggressive measures was explained -With his advanced age and multiple medical problems, his long-term prognosis is very poor -Family has elected to pursue comfort measures, which is very reasonable -We will use opiates as needed for pain and respiratory distress -Family will be allowed to visit patient per comfort care protocol -Anticipate in-hospital death   DVT prophylaxis: None, comfort measures  Code Status: DNR Family Communication: Discussed with son and daughter over phone Disposition Plan: Status is: Inpatient  Remains inpatient appropriate because:Inpatient level of care appropriate due to severity of illness   Dispo: The patient is from: Home              Anticipated d/c is to: Residential hospice versus in-hospital death              Anticipated d/c date is: 1 day              Patient currently is not medically stable to d/c.   Difficult to place patient No    Consultants:   Palliative care  Procedures:     Antimicrobials:   Unasyn 1/31 > 2/2   Subjective: Patient is unresponsive, appears comfortable  Objective: Vitals:   03/23/20 0600 03/23/20 1000 03/23/20 1132 03/23/20 1200  BP: 123/78 124/74    Pulse: 81 87 82 84  Resp: (!) 24 (!) 22 20 (!) 22  Temp:       TempSrc:      SpO2: (!) 89% (!) 88% 90% (!) 85%  Weight:      Height:       No intake or output data in the 24 hours ending 03/23/20 1953 Filed Weights   03/16/20 0330 03/21/20 0445 03/22/20 0450  Weight: 107.8 kg 106 kg 107 kg    Examination:  General exam: Unresponsive Respiratory system: Clear to auscultation. Respiratory effort normal. Cardiovascular system:RRR. No murmurs, rubs, gallops.      Data Reviewed: I have personally reviewed following labs and imaging studies  CBC: Recent Labs  Lab 03/17/20 0454 03/18/20 0610 03/20/20 1203 03/21/20 0552 03/22/20 0507  WBC 6.2 8.9 6.8 9.6 14.7*  NEUTROABS 5.5 7.2  --   --   --   HGB 12.8* 14.6 14.6 14.2 13.8  HCT 43.2 48.7 48.7 48.8 46.8  MCV 94.1 93.1 93.8 96.1 95.5  PLT 248 305 399 297 850   Basic Metabolic Panel: Recent Labs  Lab 03/17/20 0454 03/18/20 0610 03/20/20 0609 03/21/20 0552 03/22/20 0507 03/23/20 0630  NA 143 146* 146* 148* 144  --   K 4.5 4.3 5.1 5.2* 5.3*  --   CL 116* 116* 115* 118* 112*  --   CO2 22 23 20* 18* 21*  --   GLUCOSE 125* 83 113* 85 95  --   BUN 62* 65* 59* 79* 96*  --   CREATININE 1.81* 1.89* 1.81* 3.91* 4.51* 4.02*  CALCIUM 8.6* 8.9 9.0 8.5* 8.7*  --    GFR: Estimated Creatinine Clearance: 15 mL/min (A) (by C-G formula based on SCr of 4.02 mg/dL (H)). Liver Function Tests: Recent Labs  Lab 03/17/20 0454 03/18/20 0610 03/20/20 0609  AST 68* 93* 50*  ALT 56* 95* 90*  ALKPHOS 65 73 78  BILITOT 0.6 0.6 0.8  PROT 5.8* 6.4* 6.6  ALBUMIN 2.5* 2.7* 2.8*   No results for input(s): LIPASE, AMYLASE in the last 168 hours. No results for input(s): AMMONIA in the last 168 hours. Coagulation Profile: No results for input(s): INR, PROTIME in the last 168 hours. Cardiac Enzymes: No results for input(s): CKTOTAL, CKMB, CKMBINDEX, TROPONINI in the last 168 hours. BNP (last 3 results) No results for input(s): PROBNP in the last 8760 hours. HbA1C: No results for input(s): HGBA1C  in the last 72 hours. CBG: Recent Labs  Lab 03/21/20 1635 03/21/20 2337 03/22/20 1150 03/22/20 1822 03/23/20 0002  GLUCAP 100* 93 79 95 75   Lipid Profile: No results for input(s): CHOL, HDL, LDLCALC, TRIG, CHOLHDL, LDLDIRECT in the last 72 hours. Thyroid Function Tests: No results for input(s): TSH, T4TOTAL, FREET4, T3FREE, THYROIDAB in the last 72 hours. Anemia Panel: No results for input(s): VITAMINB12, FOLATE, FERRITIN, TIBC, IRON, RETICCTPCT in the last 72 hours. Sepsis Labs: Recent Labs  Lab 03/20/20 1203 03/21/20 0552 03/22/20 0507  PROCALCITON 0.56 23.20 33.77    Recent Results (from the past 240 hour(s))  Urine culture     Status: Abnormal   Collection Time: 03/11/2020  9:14 PM   Specimen: Urine, Clean Catch  Result Value Ref Range Status   Specimen Description   Final  URINE, CLEAN CATCH Performed at [facility], [address] [place], [place] [address]    Special Requests   Final    NONE Performed at [facility], [address] [place], [place] [address]    Culture (A)  Final    >=100,000 COLONIES/mL GROUP B STREP(S.AGALACTIAE)ISOLATED TESTING AGAINST S. AGALACTIAE NOT ROUTINELY PERFORMED DUE TO PREDICTABILITY OF AMP/PEN/VAN SUSCEPTIBILITY. Performed at [facility], [place] [address], [place], [place] [address]    Report Status 03/16/2020 FINAL  Final  SARS Coronavirus 2 by RT PCR (hospital order, performed in [facility] hospital lab) Nasopharyngeal Nasopharyngeal Swab     Status: Abnormal   Collection Time: 02/25/2020  9:15 PM   Specimen: Nasopharyngeal Swab  Result Value Ref Range Status   SARS Coronavirus 2 POSITIVE (A) NEGATIVE Final    Comment: RESULT CALLED TO, READ BACK BY AND VERIFIED WITH: R THOMPSON,RN@2304  03/15/2020 MKELLY (NOTE) SARS-CoV-2 target nucleic acids are DETECTED  SARS-CoV-2 RNA is generally detectable in upper respiratory specimens  during the acute phase of infection.  Positive results are indicative  of the presence of  the identified virus, but do not rule out bacterial infection or co-infection with other pathogens not detected by the test.  Clinical correlation with patient history and  other diagnostic information is necessary to determine patient infection status.  The expected result is negative.  Fact Sheet for Patients:   StrictlyIdeas.no   Fact Sheet for Healthcare Providers:   BankingDealers.co.za    This test is not yet approved or cleared by the [place] FDA and  has been authorized for detection and/or diagnosis of SARS-CoV-2 by FDA under an Emergency Use Authorization (EUA).  This EUA will remain in effect (meaning this te st can be used) for the duration of  the COVID-19 declaration under Section 564(b)(1) of the Act, 21 U.S.C. section 360-bbb-3(b)(1), unless the authorization is terminated or revoked sooner.  Performed at [facility], [address], [place], [place] [address]   Blood Culture (routine x 2)     Status: None   Collection Time: 03/06/2020 11:42 PM   Specimen: BLOOD  Result Value Ref Range Status   Specimen Description BLOOD LEFT ANTECUBITAL  Final   Special Requests   Final    BOTTLES DRAWN AEROBIC AND ANAEROBIC Blood Culture adequate volume   Culture   Final    NO GROWTH 5 DAYS Performed at [facility], [address] [place], [place] [address]    Report Status 03/18/2020 FINAL  Final  Blood Culture (routine x 2)     Status: None   Collection Time: 03/05/2020 11:42 PM   Specimen: BLOOD RIGHT HAND  Result Value Ref Range Status   Specimen Description BLOOD RIGHT HAND  Final   Special Requests   Final    BOTTLES DRAWN AEROBIC AND ANAEROBIC Blood Culture adequate volume   Culture   Final    NO GROWTH 5 DAYS Performed at [facility], [address], [place],  [address]    Report Status 03/18/2020 FINAL  Final  MRSA PCR Screening     Status: None   Collection Time: 03/14/20 12:48 PM   Specimen: Nasal  Mucosa; Nasopharyngeal  Result Value Ref Range Status   MRSA by PCR NEGATIVE NEGATIVE Final    Comment:        The GeneXpert MRSA Assay (FDA approved for NASAL specimens only), is one component of a comprehensive MRSA colonization surveillance program. It is not intended to diagnose MRSA infection  nor to guide or monitor treatment for MRSA infections. Performed at [facility], [address] [place], [place] [address]   Culture, blood (Routine X 2) w Reflex to ID Panel     Status: None (Preliminary result)   Collection Time: 03/21/20  9:11 AM   Specimen: BLOOD RIGHT HAND  Result Value Ref Range Status   Specimen Description BLOOD RIGHT HAND  Final   Special Requests   Final    BOTTLES DRAWN AEROBIC AND ANAEROBIC Blood Culture adequate volume   Culture   Final    NO GROWTH 2 DAYS Performed at [facility], [address] [place], [place] [address]    Report Status PENDING  Incomplete  Culture, blood (Routine X 2) w Reflex to ID Panel     Status: None (Preliminary result)   Collection Time: 03/21/20  9:11 AM   Specimen: BLOOD  Result Value Ref Range Status   Specimen Description BLOOD RIGHT ANTECUBITAL  Final   Special Requests   Final    BOTTLES DRAWN AEROBIC AND ANAEROBIC Blood Culture adequate volume   Culture   Final    NO GROWTH 2 DAYS Performed at [facility], [address] [place], [place] [address]    Report Status PENDING  Incomplete  Urine Culture     Status: Abnormal (Preliminary result)   Collection Time: 03/22/20 12:04 AM   Specimen: Urine, Catheterized  Result Value Ref Range Status   Specimen Description   Final    URINE, CATHETERIZED Performed at [facility], [address], [place], [place] [address]    Special Requests   Final    Normal Performed at [facility], [address] [place], [place] [address]    Culture (A)  Final    >=100,000 COLONIES/mL PROTEUS MIRABILIS SUSCEPTIBILITIES TO FOLLOW Performed at [facility], [place] [address], [place], [place] [address]    Report Status PENDING  Incomplete         Radiology Studies: DG CHEST PORT 1 VIEW  Result Date: 03/22/2020 CLINICAL DATA:  COVID pneumonia.  History of bladder cancer. EXAM: PORTABLE CHEST 1 VIEW COMPARISON:  03/20/2020.  03/19/2020 FINDINGS: Stable cardiomegaly. Thoracic aortic prominence again noted suggesting thoracic aortic aneurysm. No interim change. Diffuse bilateral interstitial infiltrates/edema with interim progression from prior exams. Dense left base atelectasis. Atelectatic changes right upper lobe. Small left pleural effusion again noted. No pneumothorax. IMPRESSION: 1. Stable cardiomegaly. Thoracic aortic prominence again noted suggesting thoracic aortic aneurysm. 2. Diffuse bilateral interstitial infiltrates/edema with interim progression from prior exams. Dense left base atelectasis. Atelectatic changes right upper lobe. Small left pleural effusion again noted. Electronically Signed   By: Marcello Moores  Register   On: 03/22/2020 05:21        Scheduled Meds:  Continuous Infusions: . sodium chloride Stopped (03/18/20 1102)     LOS: 9 days    Time spent: 63mins    Kathie Dike, MD [facility]   If 7PM-7AM, please contact night-coverage [email or web address]  03/23/2020, 7:53 PM

## 2020-03-23 NOTE — Progress Notes (Signed)
Nutrition Brief Note  Patient identified on the Malnutrition Screening Tool (MST) Report  Chart reviewed.  Pt has transitioned to comfort care.   No nutrition interventions warranted at this time.   Please consult as needed.   Suzanne Chealsea Paske, RD, LDN Clinical Nutrition After Hours/Weekend Pager # in Amion    

## 2020-03-24 DIAGNOSIS — J690 Pneumonitis due to inhalation of food and vomit: Secondary | ICD-10-CM | POA: Diagnosis not present

## 2020-03-24 DIAGNOSIS — G9341 Metabolic encephalopathy: Secondary | ICD-10-CM

## 2020-03-24 LAB — URINE CULTURE
Culture: >=100000 — AB
Special Requests: NORMAL

## 2020-03-26 LAB — CULTURE, BLOOD (ROUTINE X 2)
Culture: NO GROWTH
Culture: NO GROWTH
Special Requests: ADEQUATE
Special Requests: ADEQUATE

## 2020-03-29 ENCOUNTER — Other Ambulatory Visit: Payer: Medicare HMO | Admitting: Urology

## 2020-04-18 NOTE — Death Summary Note (Signed)
DEATH SUMMARY   Patient Details  Name: Scott Harvey MRN: [id number] DOB: February 10, 1929  Admission/Discharge Information   Admit Date:  03-31-20  Date of Death: Date of Death: 2020/04/11  Time of Death: Time of Death: 0320  Length of Stay: 05-16-22  Referring Physician: Asencion Noble, MD   Reason(s) for Hospitalization  Altered mental status and hypoxia  Diagnoses  Preliminary cause of death: Acute respiratory failure with hypoxia secondary to COVID-19 pneumonia Secondary Diagnoses (including complications and co-morbidities):  Principal Problem:   Pneumonia due to COVID-19 virus Active Problems:   Class 2 obesity   Acute renal failure superimposed on stage 3b chronic kidney disease (HCC)   Proteinuria   Hypoalbuminemia   Acute respiratory failure with hypoxia ([place])   Aspiration pneumonia ([place])   Acute metabolic encephalopathy   Brief Hospital Course (including significant findings, care, treatment, and services provided and events leading to death)  Scott Harvey is a 87 y.o. year old male who had a history of bladder cancer, chronic kidney disease stage IIIb, previous history of renal cell carcinoma status post left nephrectomy, was brought to the hospital with altered mental status.  He was found to be significantly hypoxic and was noted to have COVID-19 pneumonia.  He was treated with remdesivir, steroids and baricitinib.  He initially started to improve, but subsequently had a decompensation episode on 1/31 where he was felt to be possibly aspirating.  Patient required BiPAP and subsequently became BiPAP dependent.  Overall mental status did not have any significant improvements.  He was treated with intravenous antibiotics.  Unfortunately, renal function continued to decline.  Palliative care was consulted and assisted with goals of care conversation with the family.  After several conversations, it was decided to transition to comfort measures.  Patient was started on opiates for  respiratory distress.  He passed away in the hospital on 2/4.    Pertinent Labs and Studies  Significant Diagnostic Studies CT Head Wo Contrast  Result Date: 2020-03-31 CLINICAL DATA:  Mental status changes EXAM: CT HEAD WITHOUT CONTRAST TECHNIQUE: Contiguous axial images were obtained from the base of the skull through the vertex without intravenous contrast. COMPARISON:  None. FINDINGS: Brain: There is atrophy and chronic small vessel disease changes. No acute intracranial abnormality. Specifically, no hemorrhage, hydrocephalus, mass lesion, acute infarction, or significant intracranial injury. Vascular: No hyperdense vessel or unexpected calcification. Skull: No acute calvarial abnormality. Sinuses/Orbits: No acute findings Other: None IMPRESSION: Atrophy, chronic microvascular disease. No acute intracranial abnormality. Electronically Signed   By: Rolm Baptise M.D.   On: March 31, 2020 22:23   US Venous Img Lower Bilateral (DVT)  Result Date: 03/20/2020 CLINICAL DATA:  Respiratory failure secondary to COVID-19 pneumonia. Elevated D-dimer. EXAM: BILATERAL LOWER EXTREMITY VENOUS DOPPLER ULTRASOUND TECHNIQUE: Gray-scale sonography with graded compression, as well as color Doppler and duplex ultrasound were performed to evaluate the lower extremity deep venous systems from the level of the common femoral vein and including the common femoral, femoral, profunda femoral, popliteal and calf veins including the posterior tibial, peroneal and gastrocnemius veins when visible. The superficial great saphenous vein was also interrogated. Spectral Doppler was utilized to evaluate flow at rest and with distal augmentation maneuvers in the common femoral, femoral and popliteal veins. COMPARISON:  None. FINDINGS: RIGHT LOWER EXTREMITY Common Femoral Vein: No evidence of thrombus. Normal compressibility, respiratory phasicity and response to augmentation. Saphenofemoral Junction: No evidence of thrombus. Normal  compressibility and flow on color Doppler imaging. Profunda Femoral Vein: No evidence of thrombus.  Normal compressibility and flow on color Doppler imaging. Femoral Vein: No evidence of thrombus. Normal compressibility, respiratory phasicity and response to augmentation. Popliteal Vein: No evidence of thrombus. Normal compressibility, respiratory phasicity and response to augmentation. Calf Veins: No evidence of thrombus. Normal compressibility and flow on color Doppler imaging. Superficial Great Saphenous Vein: No evidence of thrombus. Normal compressibility. Venous Reflux:  None. Other Findings: No evidence of superficial thrombophlebitis or abnormal fluid collection. LEFT LOWER EXTREMITY Common Femoral Vein: No evidence of thrombus. Normal compressibility, respiratory phasicity and response to augmentation. Saphenofemoral Junction: No evidence of thrombus. Normal compressibility and flow on color Doppler imaging. Profunda Femoral Vein: No evidence of thrombus. Normal compressibility and flow on color Doppler imaging. Femoral Vein: No evidence of thrombus. Normal compressibility, respiratory phasicity and response to augmentation. Popliteal Vein: No evidence of thrombus. Normal compressibility, respiratory phasicity and response to augmentation. Calf Veins: No evidence of thrombus. Normal compressibility and flow on color Doppler imaging. Superficial Great Saphenous Vein: No evidence of thrombus. Normal compressibility. Venous Reflux:  None. Other Findings: No evidence of superficial thrombophlebitis or abnormal fluid collection. IMPRESSION: No evidence of deep venous thrombosis in either lower extremity. Electronically Signed   By: Aletta Edouard M.D.   On: 03/20/2020 15:53   DG CHEST PORT 1 VIEW  Result Date: 03/22/2020 CLINICAL DATA:  COVID pneumonia.  History of bladder cancer. EXAM: PORTABLE CHEST 1 VIEW COMPARISON:  03/20/2020.  03/19/2020 FINDINGS: Stable cardiomegaly. Thoracic aortic prominence again  noted suggesting thoracic aortic aneurysm. No interim change. Diffuse bilateral interstitial infiltrates/edema with interim progression from prior exams. Dense left base atelectasis. Atelectatic changes right upper lobe. Small left pleural effusion again noted. No pneumothorax. IMPRESSION: 1. Stable cardiomegaly. Thoracic aortic prominence again noted suggesting thoracic aortic aneurysm. 2. Diffuse bilateral interstitial infiltrates/edema with interim progression from prior exams. Dense left base atelectasis. Atelectatic changes right upper lobe. Small left pleural effusion again noted. Electronically Signed   By: Marcello Moores  Register   On: 03/22/2020 05:21   DG CHEST PORT 1 VIEW  Result Date: 03/20/2020 CLINICAL DATA:  Cough, dyspnea, COVID-19 positive EXAM: PORTABLE CHEST 1 VIEW COMPARISON:  03/19/2020 FINDINGS: Stable heart size. Prominence of the aortic knob suspicious for underlying thoracic aortic aneurysm. Streaky interstitial opacities bilaterally are similar to prior and compatible with multifocal viral pneumonia in the setting of COVID 19 infection. No appreciable pleural fluid collection. No pneumothorax. IMPRESSION: 1. Persistent streaky interstitial opacities bilaterally compatible with multifocal viral pneumonia in the setting of COVID-19 infection. 2. Prominence of the aortic knob suspicious for underlying thoracic aortic aneurysm. Electronically Signed   By: Davina Poke D.O.   On: 03/20/2020 11:52   DG CHEST PORT 1 VIEW  Result Date: 03/19/2020 CLINICAL DATA:  Dyspnea, respiratory abnormalities, COVID-19 positive on 02/21/2020 in a past history of bladder cancer and renal cell carcinoma, former smoker EXAM: PORTABLE CHEST 1 VIEW COMPARISON:  Portable exam 0911 hours compared to 02/23/2020 FINDINGS: Normal heart size, mediastinal contours, and pulmonary vascularity. Patchy infiltrates bilaterally consistent with multifocal pneumonia and history of COVID-19. No pleural effusion or  pneumothorax. Bones demineralized. IMPRESSION: Persistent BILATERAL pulmonary infiltrates consistent with multifocal pneumonia and COVID-19. Electronically Signed   By: Lavonia Dana M.D.   On: 03/19/2020 09:32   DG Chest Portable 1 View  Result Date: 02/25/2020 CLINICAL DATA:  87 year old male with shortness of breath. EXAM: PORTABLE CHEST 1 VIEW COMPARISON:  Chest radiograph dated 11/02/2010. FINDINGS: There is mild eventration of the left hemidiaphragm. Mid to lower lung field interstitial  and hazy airspace density may represent edema or pneumonia or combination. Clinical correlation is recommended. No large pleural scars and a small left pleural effusion may be present. No pneumothorax. Stable cardiac silhouette. No acute osseous pathology. IMPRESSION: Mid to lower lung field interstitial and hazy airspace density may represent edema or pneumonia or combination. Electronically Signed   By: Anner Crete M.D.   On: 02/25/2020 22:16    Microbiology Recent Results (from the past 240 hour(s))  Culture, blood (Routine X 2) w Reflex to ID Panel     Status: None (Preliminary result)   Collection Time: 03/21/20  9:11 AM   Specimen: BLOOD RIGHT HAND  Result Value Ref Range Status   Specimen Description BLOOD RIGHT HAND  Final   Special Requests   Final    BOTTLES DRAWN AEROBIC AND ANAEROBIC Blood Culture adequate volume   Culture   Final    NO GROWTH 3 DAYS Performed at [facility], [address], [place], [place] [address]    Report Status PENDING  Incomplete  Culture, blood (Routine X 2) w Reflex to ID Panel     Status: None (Preliminary result)   Collection Time: 03/21/20  9:11 AM   Specimen: BLOOD  Result Value Ref Range Status   Specimen Description BLOOD RIGHT ANTECUBITAL  Final   Special Requests   Final    BOTTLES DRAWN AEROBIC AND ANAEROBIC Blood Culture adequate volume   Culture   Final    NO GROWTH 3 DAYS Performed at [facility], [address] [place], [place] [address]     Report Status PENDING  Incomplete  Urine Culture     Status: Abnormal   Collection Time: 03/22/20 12:04 AM   Specimen: Urine, Catheterized  Result Value Ref Range Status   Specimen Description   Final    URINE, CATHETERIZED Performed at [facility], [address], [place], [place] [address]    Special Requests   Final    Normal Performed at [facility], [address], [place], [place] [address]    Culture >=100,000 COLONIES/mL PROTEUS MIRABILIS (A)  Final   Report Status 03/28/20 FINAL  Final   Organism ID, Bacteria PROTEUS MIRABILIS (A)  Final      Susceptibility   Proteus mirabilis - MIC*    AMPICILLIN <=2 SENSITIVE Sensitive     CEFAZOLIN <=4 SENSITIVE Sensitive     CEFEPIME <=0.12 SENSITIVE Sensitive     CEFTRIAXONE <=0.25 SENSITIVE Sensitive     CIPROFLOXACIN <=0.25 SENSITIVE Sensitive     GENTAMICIN <=1 SENSITIVE Sensitive     IMIPENEM 2 SENSITIVE Sensitive     NITROFURANTOIN 128 RESISTANT Resistant     TRIMETH/SULFA <=20 SENSITIVE Sensitive     AMPICILLIN/SULBACTAM <=2 SENSITIVE Sensitive     PIP/TAZO <=4 SENSITIVE Sensitive     * >=100,000 COLONIES/mL PROTEUS MIRABILIS    Lab Basic Metabolic Panel: Recent Labs  Lab 03/18/20 0610 03/20/20 0609 03/21/20 0552 03/22/20 0507 03/23/20 0630  NA 146* 146* 148* 144  --   K 4.3 5.1 5.2* 5.3*  --   CL 116* 115* 118* 112*  --   CO2 23 20* 18* 21*  --   GLUCOSE 83 113* 85 95  --   BUN 65* 59* 79* 96*  --   CREATININE 1.89* 1.81* 3.91* 4.51* 4.02*  CALCIUM 8.9 9.0 8.5* 8.7*  --    Liver Function Tests: Recent Labs  Lab 03/18/20 0610 03/20/20 0609  AST 93* 50*  ALT 95* 90*  ALKPHOS 73 78  BILITOT 0.6 0.8  PROT 6.4* 6.6  ALBUMIN 2.7* 2.8*   No results for input(s): LIPASE, AMYLASE in the last 168 hours. No results for input(s): AMMONIA in the last 168 hours. CBC: Recent Labs  Lab 03/18/20 0610 03/20/20 1203 03/21/20 0552 03/22/20 0507  WBC 8.9 6.8 9.6 14.7*  NEUTROABS 7.2  --   --   --   HGB 14.6  14.6 14.2 13.8  HCT 48.7 48.7 48.8 46.8  MCV 93.1 93.8 96.1 95.5  PLT 305 399 297 297   Cardiac Enzymes: No results for input(s): CKTOTAL, CKMB, CKMBINDEX, TROPONINI in the last 168 hours. Sepsis Labs: Recent Labs  Lab 03/18/20 0610 03/20/20 1203 03/21/20 0552 03/22/20 0507  PROCALCITON  --  0.56 23.20 33.77  WBC 8.9 6.8 9.6 14.7*    Procedures/Operations     Shelbe Haglund 2020/03/27, 8:40 PM

## 2020-04-18 DEATH — deceased

## 2022-09-26 IMAGING — CT CT HEAD W/O CM
3 series · 16 of 47 positions shown, 19 images · non-contrast
Comparison: None.

CLINICAL DATA: Mental status changes

EXAM:
CT HEAD WITHOUT CONTRAST
TECHNIQUE: Contiguous axial images were obtained from the base of the skull
through the vertex without intravenous contrast.

[Series 2: head w o · axial · 0.42mm/px · z∈[+60,+205]mm · 10 of 35 slices shown, 13 images]
[im 3/35  brain]
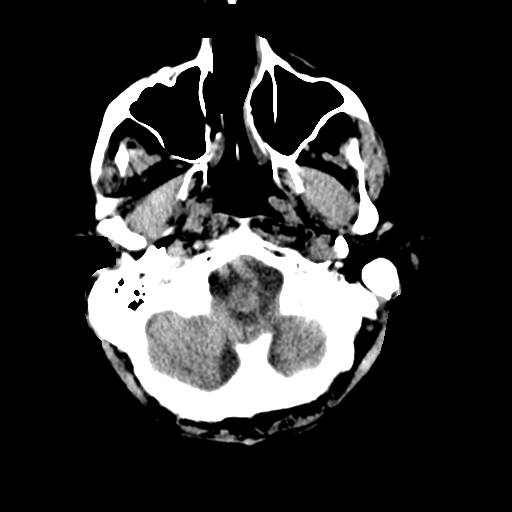
[im 3/35  bone]
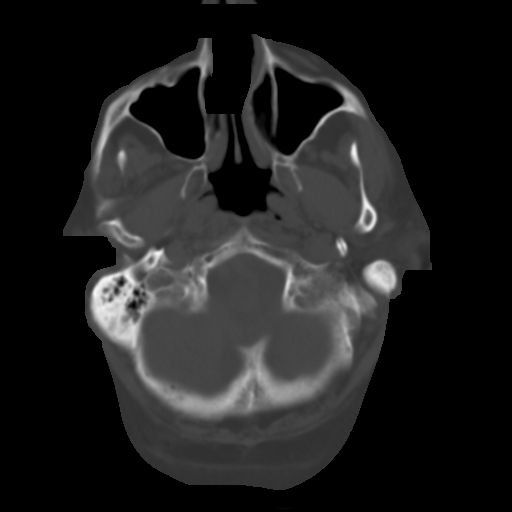
[im 6/35  brain]
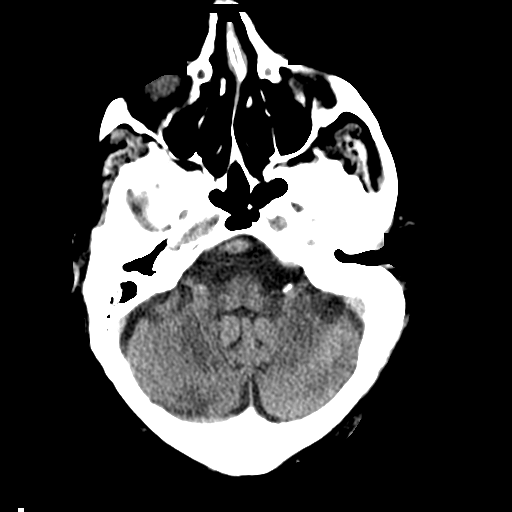
[im 10/35  brain]
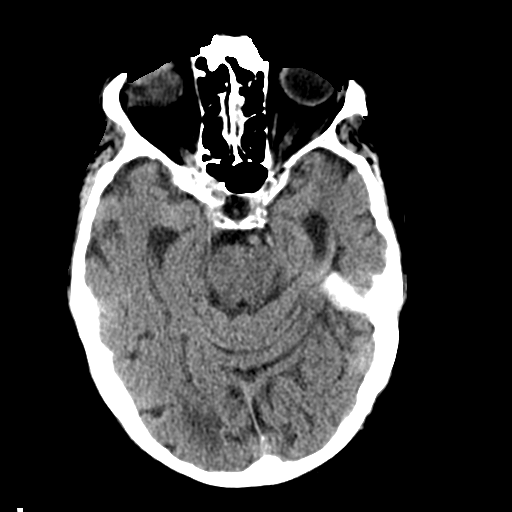
[im 12/35  brain]
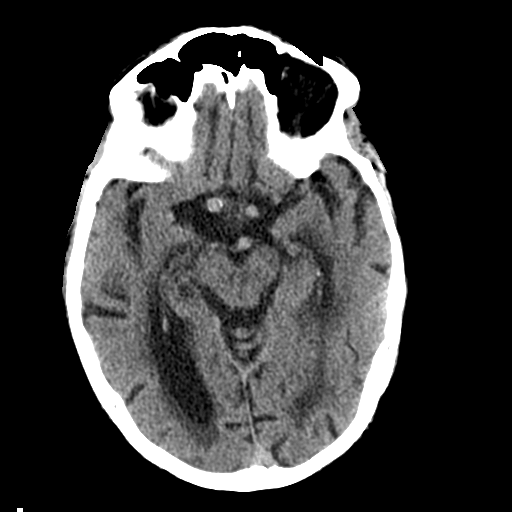
[im 16/35  brain]
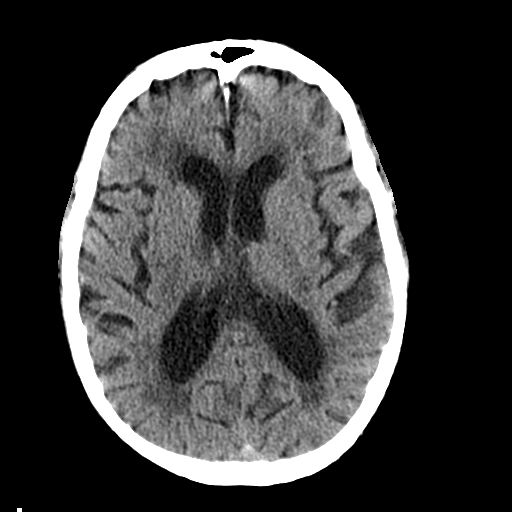
[im 16/35  bone]
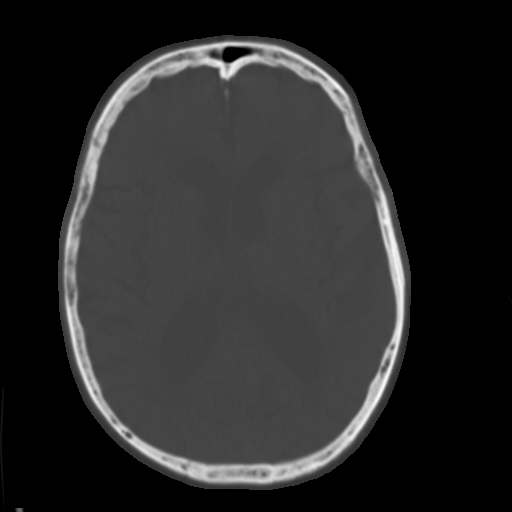
[im 19/35  brain]
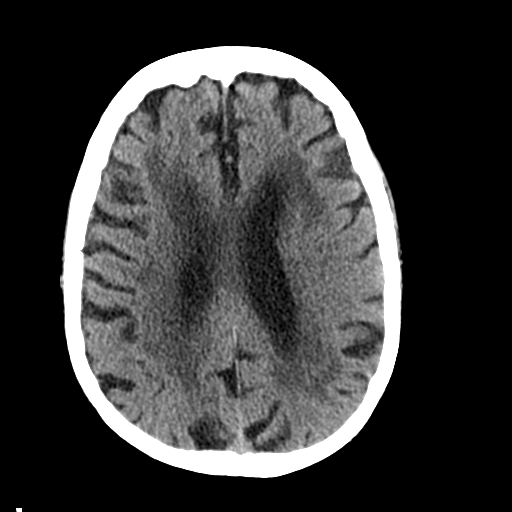
[im 23/35  brain]
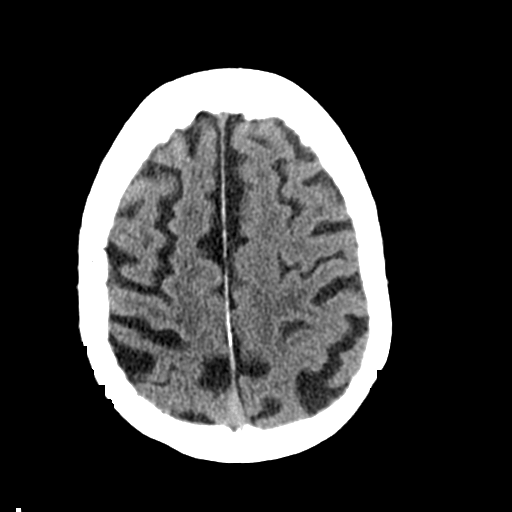
[im 26/35  brain]
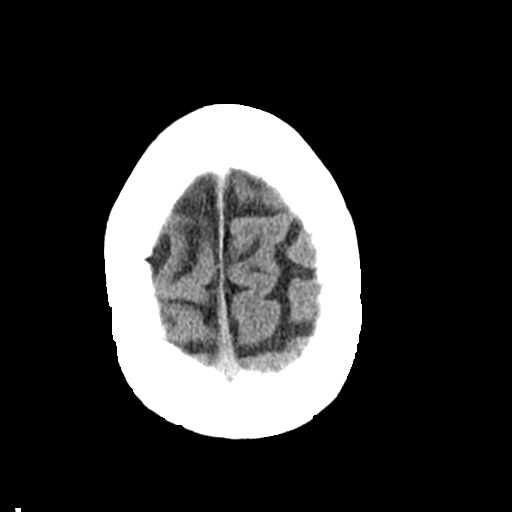
[im 29/35  brain]
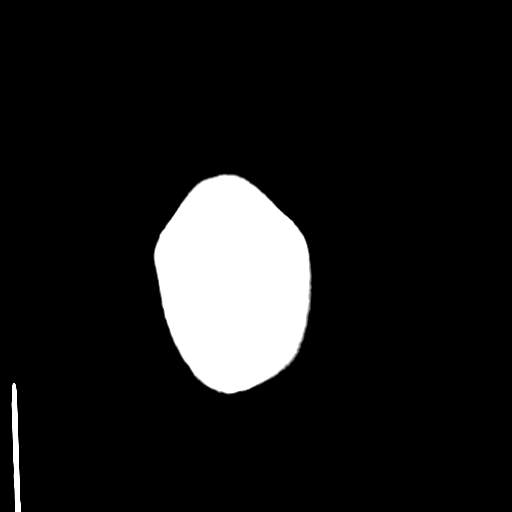
[im 29/35  bone]
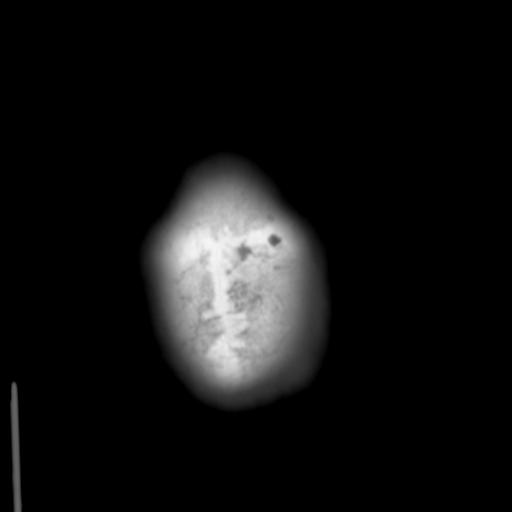
[im 32/35  brain]
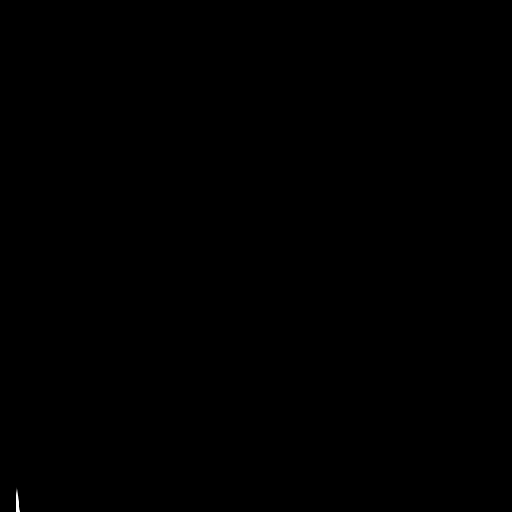

[Series 4: coronal soft · coronal · 0.32mm/px · 3 of 65 slices shown]
[im 22/65  brain]
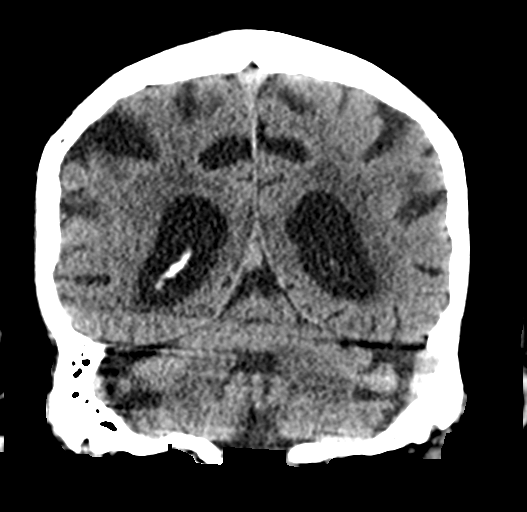
[im 29/65  brain]
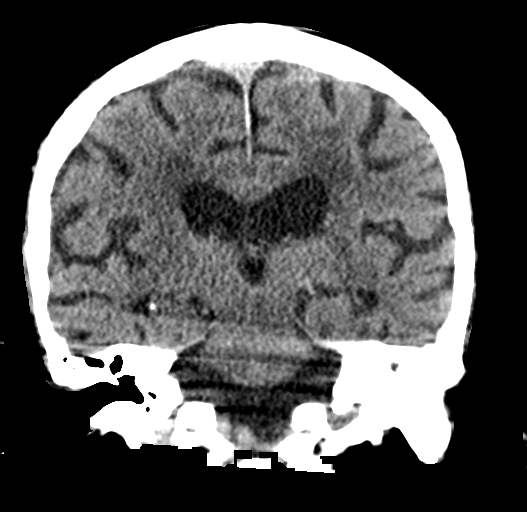
[im 36/65  brain]
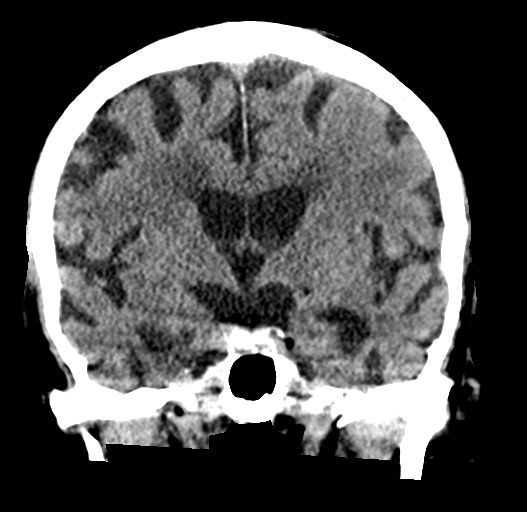

[Series 5: sagittal soft · sagittal · 0.33mm/px · 3 of 52 slices shown]
[im 18/52  brain]
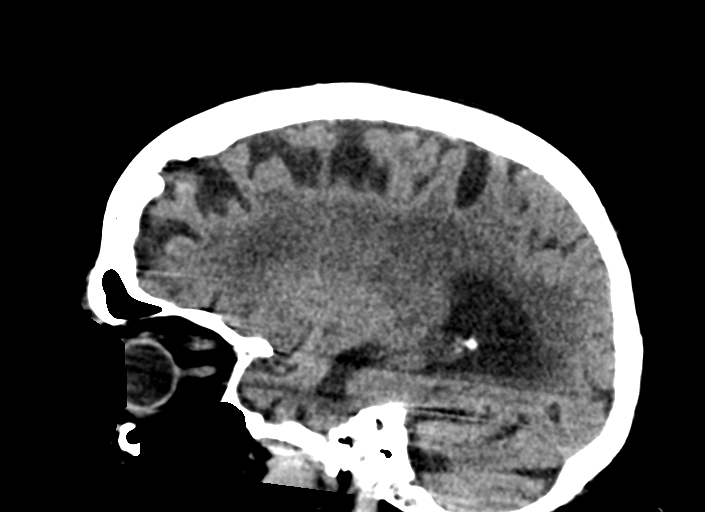
[im 26/52  brain]
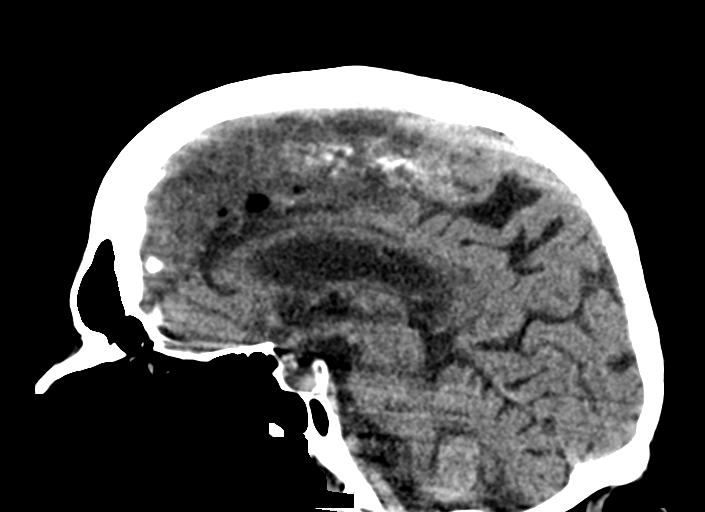
[im 35/52  brain]
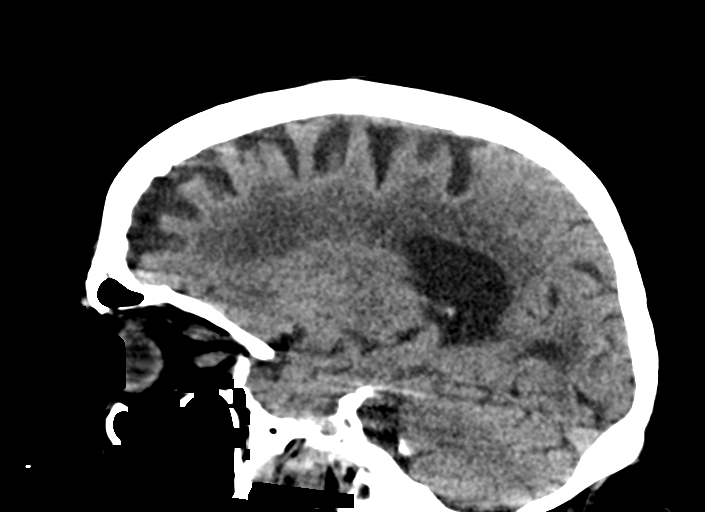

[16 of 47 positions shown; findings below may reference images not displayed]

FINDINGS: Brain: There is atrophy and chronic small vessel disease changes. No
acute intracranial abnormality. Specifically, no hemorrhage,
hydrocephalus, mass lesion, acute infarction, or significant
intracranial injury.

Vascular: No hyperdense vessel or unexpected calcification.

Skull: No acute calvarial abnormality.

Sinuses/Orbits: No acute findings

Other: None
IMPRESSION: Atrophy, chronic microvascular disease.

No acute intracranial abnormality.

## 2022-10-03 IMAGING — US US EXTREM LOW VENOUS
1 series · 13 of 24 positions shown · non-contrast
Comparison: None.

CLINICAL DATA: Respiratory failure secondary to [id number] pneumonia.
Elevated D-dimer.



[Series 1: us venous img lower bilat (dvt) · portal-venous · 13 of 64 slices shown]
[im 1/64]
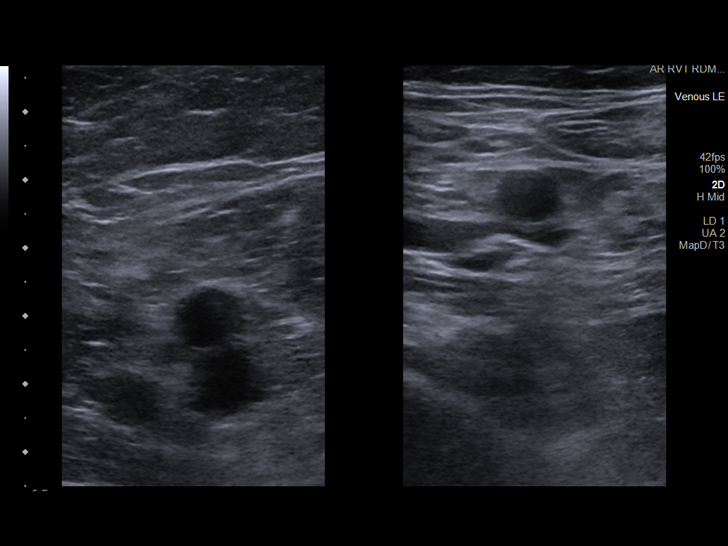
[im 6/64]
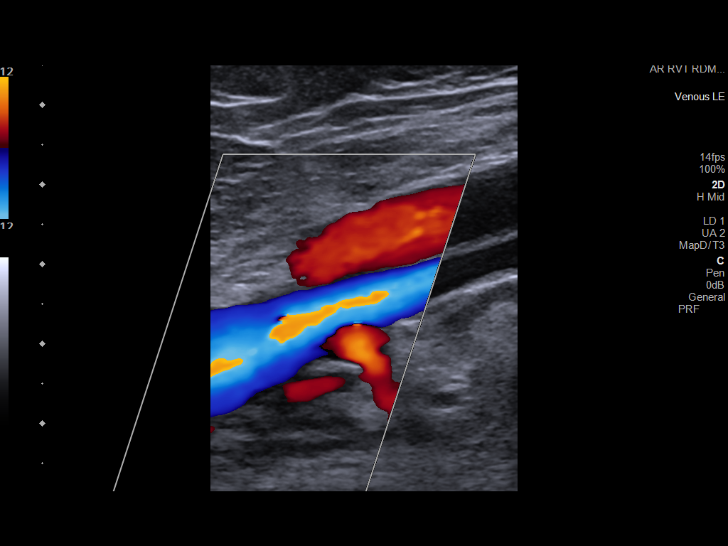
[im 11/64]
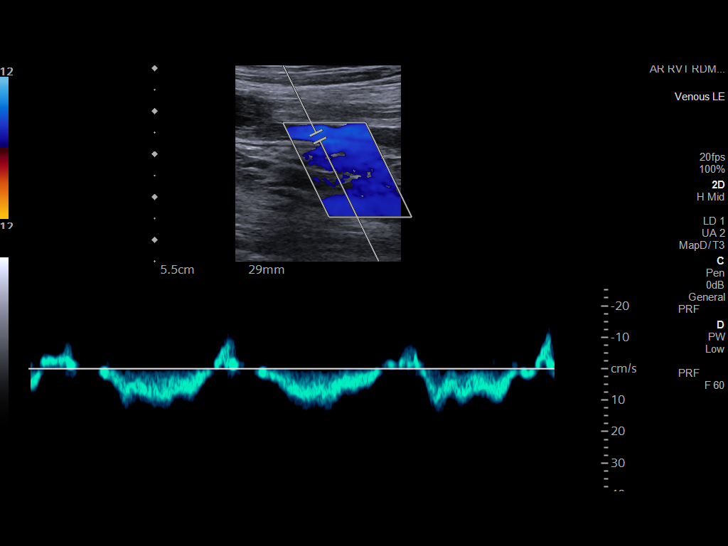
[im 17/64]
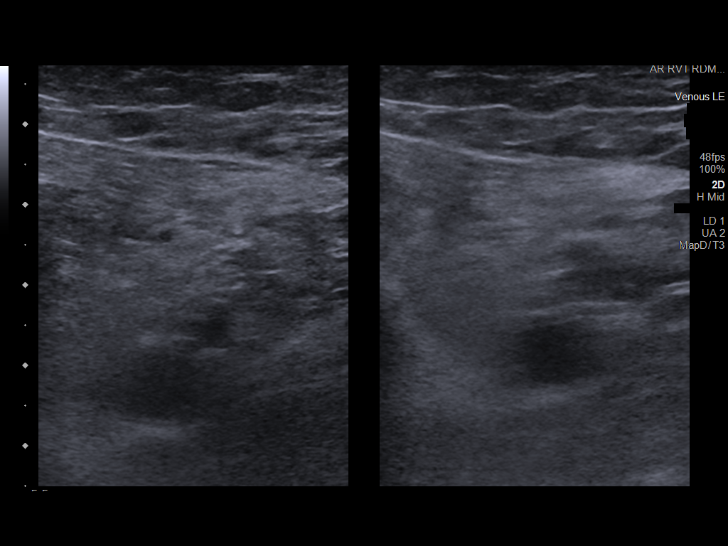
[im 22/64]
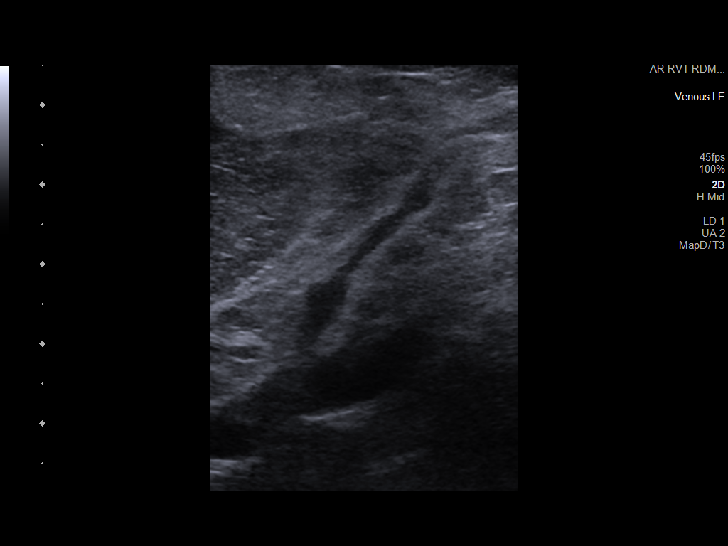
[im 28/64]
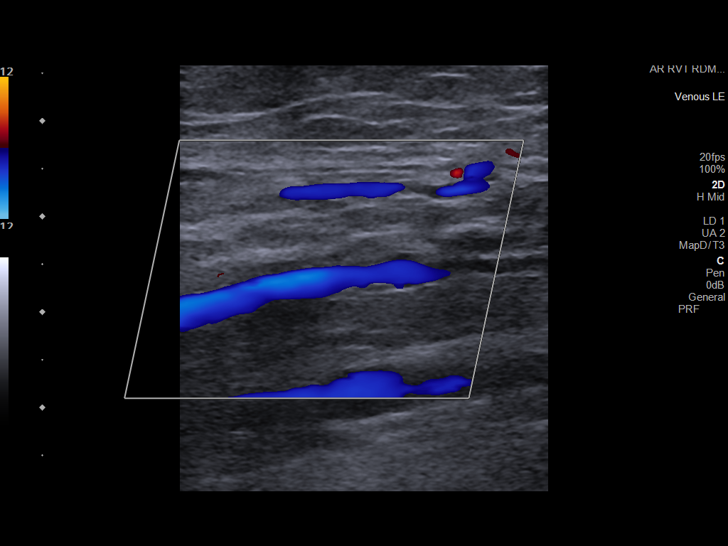
[im 33/64]
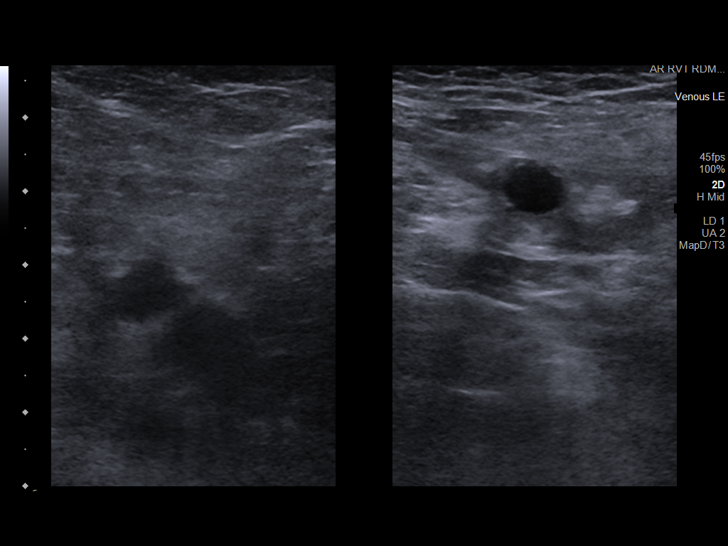
[im 36/64]
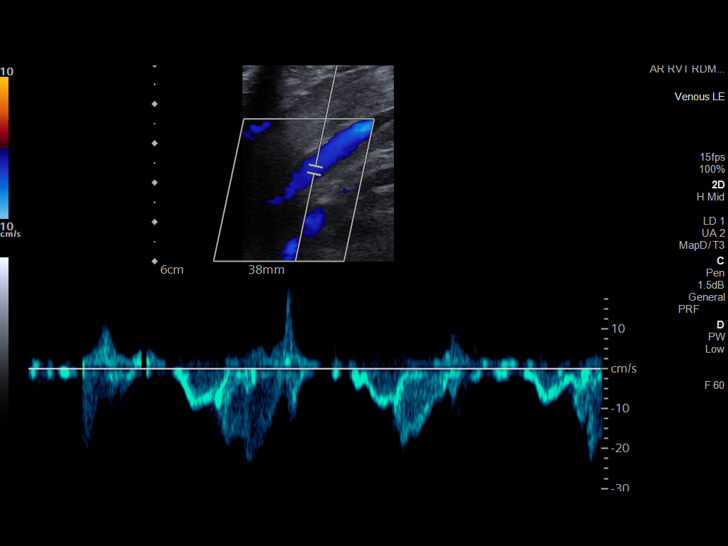
[im 42/64]
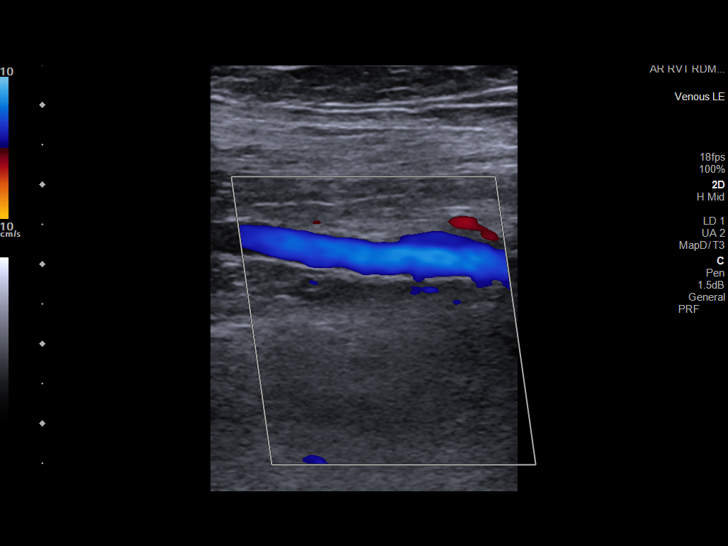
[im 47/64]
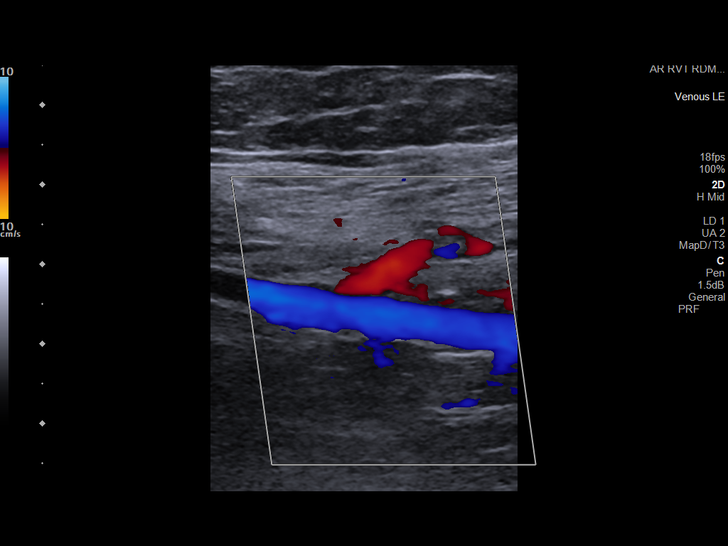
[im 53/64]
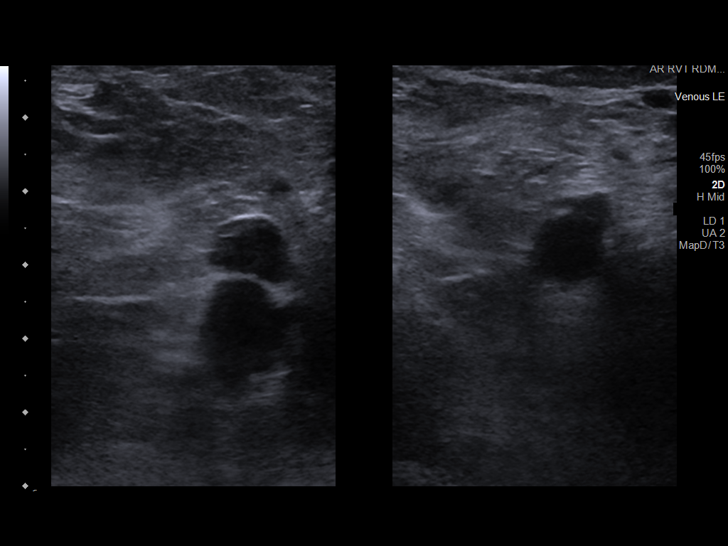
[im 58/64]
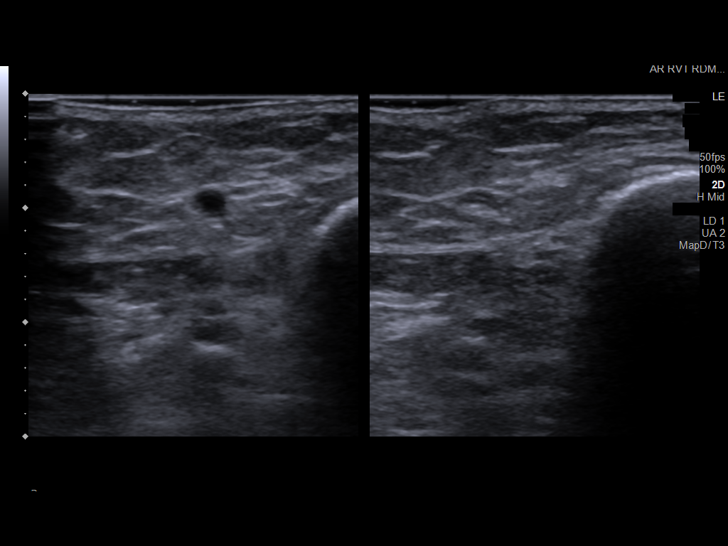
[im 64/64]
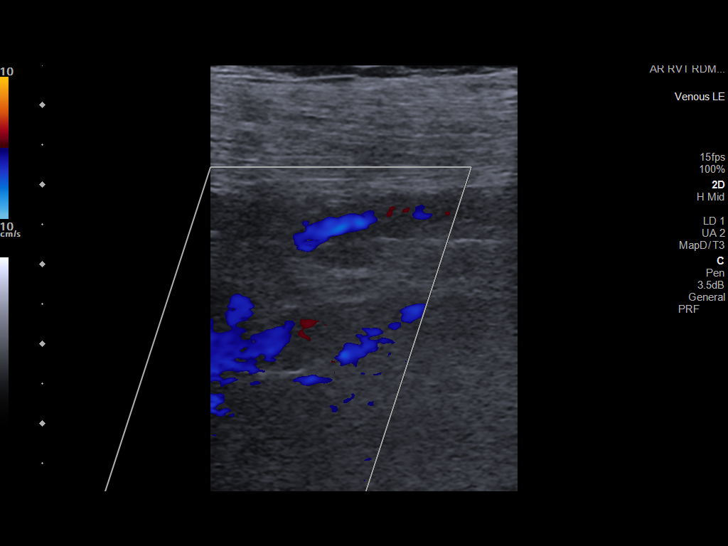

[13 of 24 positions shown; findings below may reference images not displayed]

FINDINGS: RIGHT LOWER EXTREMITY

Common Femoral Vein: No evidence of thrombus. Normal
compressibility, respiratory phasicity and response to augmentation.

Saphenofemoral Junction: No evidence of thrombus. Normal
compressibility and flow on color Doppler imaging.

Profunda Femoral Vein: No evidence of thrombus. Normal
compressibility and flow on color Doppler imaging.

Femoral Vein: No evidence of thrombus. Normal compressibility,
respiratory phasicity and response to augmentation.

Popliteal Vein: No evidence of thrombus. Normal compressibility,
respiratory phasicity and response to augmentation.

Calf Veins: No evidence of thrombus. Normal compressibility and flow
on color Doppler imaging.

Superficial Great Saphenous Vein: No evidence of thrombus. Normal
compressibility.

Venous Reflux:  None.

Other Findings: No evidence of superficial thrombophlebitis or
abnormal fluid collection.

LEFT LOWER EXTREMITY

Common Femoral Vein: No evidence of thrombus. Normal
compressibility, respiratory phasicity and response to augmentation.

Saphenofemoral Junction: No evidence of thrombus. Normal
compressibility and flow on color Doppler imaging.

Profunda Femoral Vein: No evidence of thrombus. Normal
compressibility and flow on color Doppler imaging.

Femoral Vein: No evidence of thrombus. Normal compressibility,
respiratory phasicity and response to augmentation.

Popliteal Vein: No evidence of thrombus. Normal compressibility,
respiratory phasicity and response to augmentation.

Calf Veins: No evidence of thrombus. Normal compressibility and flow
on color Doppler imaging.

Superficial Great Saphenous Vein: No evidence of thrombus. Normal
compressibility.

Venous Reflux:  None.

Other Findings: No evidence of superficial thrombophlebitis or
abnormal fluid collection.
IMPRESSION: No evidence of deep venous thrombosis in either lower extremity.
